# Patient Record
Sex: Female | Born: 1990 | Race: White | Hispanic: No | Marital: Married | Smoking: Never smoker
Health system: Southern US, Community
[De-identification: ages and names within clinical notes are randomized; demographics above are authoritative.]

## PROBLEM LIST (undated history)

## (undated) DIAGNOSIS — F32A Depression, unspecified: Secondary | ICD-10-CM

## (undated) DIAGNOSIS — G44209 Tension-type headache, unspecified, not intractable: Secondary | ICD-10-CM

## (undated) HISTORY — DX: Depression, unspecified: F32.A

## (undated) HISTORY — DX: Tension-type headache, unspecified, not intractable: G44.209

---

## 2021-02-08 HISTORY — PX: BILATERAL SALPINGECTOMY: SHX5743

## 2021-09-15 ENCOUNTER — Ambulatory Visit (INDEPENDENT_AMBULATORY_CARE_PROVIDER_SITE_OTHER): Payer: BC Managed Care – PPO | Admitting: Family

## 2021-09-15 ENCOUNTER — Encounter: Payer: Self-pay | Admitting: Family

## 2021-09-15 ENCOUNTER — Other Ambulatory Visit: Payer: Self-pay

## 2021-09-15 VITALS — BP 122/64 | HR 72 | Ht 63.0 in | Wt 153.0 lb

## 2021-09-15 DIAGNOSIS — Z124 Encounter for screening for malignant neoplasm of cervix: Secondary | ICD-10-CM | POA: Diagnosis not present

## 2021-09-15 DIAGNOSIS — R519 Headache, unspecified: Secondary | ICD-10-CM

## 2021-09-15 DIAGNOSIS — Z13220 Encounter for screening for lipoid disorders: Secondary | ICD-10-CM

## 2021-09-15 DIAGNOSIS — G44229 Chronic tension-type headache, not intractable: Secondary | ICD-10-CM | POA: Diagnosis not present

## 2021-09-15 LAB — COMPREHENSIVE METABOLIC PANEL
ALT: 10 U/L (ref 0–35)
AST: 15 U/L (ref 0–37)
Albumin: 4.9 g/dL (ref 3.5–5.2)
Alkaline Phosphatase: 35 U/L — ABNORMAL LOW (ref 39–117)
BUN: 15 mg/dL (ref 6–23)
CO2: 32 mEq/L (ref 19–32)
Calcium: 9.7 mg/dL (ref 8.4–10.5)
Chloride: 105 mEq/L (ref 96–112)
Creatinine, Ser: 0.76 mg/dL (ref 0.40–1.20)
GFR: 105.52 mL/min (ref >60.00)
Glucose, Bld: 87 mg/dL (ref 70–99)
Potassium: 4.5 mEq/L (ref 3.5–5.1)
Sodium: 141 mEq/L (ref 135–145)
Total Bilirubin: 0.6 mg/dL (ref 0.2–1.2)
Total Protein: 7.3 g/dL (ref 6.0–8.3)

## 2021-09-15 LAB — CBC WITH DIFFERENTIAL/PLATELET
Basophils Absolute: 0 10*3/uL (ref 0.0–0.1)
Basophils Relative: 0.7 % (ref 0.0–3.0)
Eosinophils Absolute: 0.2 10*3/uL (ref 0.0–0.7)
Eosinophils Relative: 4.1 % (ref 0.0–5.0)
HCT: 38.4 % (ref 36.0–46.0)
Hemoglobin: 13 g/dL (ref 12.0–15.0)
Lymphocytes Relative: 31.1 % (ref 12.0–46.0)
Lymphs Abs: 1.3 10*3/uL (ref 0.7–4.0)
MCHC: 33.8 g/dL (ref 30.0–36.0)
MCV: 93.7 fl (ref 78.0–100.0)
Monocytes Absolute: 0.3 10*3/uL (ref 0.1–1.0)
Monocytes Relative: 7.4 % (ref 3.0–12.0)
Neutro Abs: 2.3 10*3/uL (ref 1.4–7.7)
Neutrophils Relative %: 56.7 % (ref 43.0–77.0)
Platelets: 200 10*3/uL (ref 150.0–400.0)
RBC: 4.1 Mil/uL (ref 3.87–5.11)
RDW: 14 % (ref 11.5–15.5)
WBC: 4 10*3/uL (ref 4.0–10.5)

## 2021-09-15 LAB — C-REACTIVE PROTEIN: CRP: <1 mg/dL (ref 0.5–20.0)

## 2021-09-15 LAB — SEDIMENTATION RATE: Sed Rate: 8 mm/hr (ref 0–20)

## 2021-09-15 LAB — TSH: TSH: 1.35 u[IU]/mL (ref 0.35–5.50)

## 2021-09-15 MED ORDER — BUTALBITAL-APAP-CAFFEINE 50-325-40 MG PO TABS: 1.0000 | ORAL_TABLET | Freq: Four times a day (QID) | ORAL | 0 refills | Status: DC | PRN

## 2021-09-15 NOTE — Patient Instructions (Addendum)
I want you to start with flonase daily as well as claritin daily so we can make sure that it is not sinus related contributing to your headaches.   I am also going to be starting you on fioricet for suspected tension migraines, take as directed at onset of headache. Increase water intake. Also keep a headache diary for [place] and bring to your neurology appointment.   A referral was placed today for both GYN and neurology.  Please let us know if you have not heard back within 1 week about your referral.  Stop by the lab prior to leaving today. I will notify you of your results once received.   It was a pleasure seeing you today! Please do not hesitate to reach out with any questions and or concerns.  Regards,   Mort Sawyers FNP-C

## 2021-09-15 NOTE — Assessment & Plan Note (Signed)
Suspected tension headache as she does have a lot of tightness in her posterior neck.  Prescribed Fioricet which she will take for abrupt her headaches ibuprofen as needed increase oral intake of water throughout the day.  Keep headache diary I did refer to neuro as this is new onset in the last 1 year

## 2021-09-15 NOTE — Assessment & Plan Note (Signed)
Lab work-up in place to can include CRP sed rate and TSH

## 2021-09-15 NOTE — Assessment & Plan Note (Signed)
Patient referred to GYN for Pap smear.

## 2021-09-15 NOTE — Progress Notes (Signed)
New Patient Office Visit  Subjective:  Patient ID: Amy Stanton, female    DOB: 06/02/1991  Age: 31 y.o. MRN: [id number]  CC:  Chief Complaint  Patient presents with   Headache    Pt stated--have severe headache lasted for 24hr for about 1 year consistancy.    HPI Amy Stanton is here to establish care as a new patient. Recently moved here from [place] in [place]. Last saw primary care in summer of 2022.   Prior provider was:[facility]. [facility], [facility] [address] Pt is with acute concern   Headaches: started about 1.5 years ago while she was still in [place]. At first was thinking 'this is a bad headache' but would dissipate so didn't find too concerning. In the last few months noticing that her headaches are getting much more severe, and seem to be getting progressively worse. She denies an aura prior to headache, but does start feeling increasingly groggy and tired. Typically photosensitive as well, no change in vision, sensitive to noise , and if she has to turn her head in any way the headache will intensify. The headaches do intensify and occasionally will vomit. Typically always with nausea. Does also experience diarrhea during these episodes. No loss of function of limb, and does not experience numbness. Headache typically starts at base of head and goes up to back of crown of head and then goes to bil temples 'like a headband'. Will try to take tylenol, excedrin migraine, and sinus and allergy medications without any relief at all. Mild headaches last about once weekly, 'bad severe headaches' maybe 1-2 times a month but are becoming more frequent as prior maybe one bad one a month if even. Doesn't seem to link up with her cycle.   Does have a daily post nasal drip and does suffer from chronic allergies, does use Claritin and flonase off and on. Prior to claritin which she started a few days ago she was taking xyzal.   No chance in pregnancy as history of bil  salpingectomy.   Pap smear: Summer 2022 pt would like referral to gyn  Anxiety/depression   GAD 7 : Generalized Anxiety Score 09/15/2021  Nervous, Anxious, on Edge 3  Control/stop worrying 3  Worry too much - different things 3  Trouble relaxing 3  Restless 3  Easily annoyed or irritable 2  Afraid - awful might happen 0  Total GAD 7 Score 17  Anxiety Difficulty Somewhat difficult    PHQ9 SCORE ONLY 09/15/2021  PHQ-9 Total Score 10       Past Medical History:  Diagnosis Date   Depression     Past Surgical History:  Procedure Laterality Date   BILATERAL SALPINGECTOMY Bilateral 02/08/2021   pt chose to do this for suspected endometrosis    Family History  Problem Relation Age of Onset   Arthritis Mother    Migraines Mother    Sleep apnea Mother    Migraines Maternal Grandmother    Seizures Maternal Grandmother        not epilepsy    Social History   Socioeconomic History   Marital status: Married    Spouse name: david   Number of children: 0   Years of education: Not on file   Highest education level: Not on file  Occupational History   Not on file  Tobacco Use   Smoking status: Never   Smokeless tobacco: Never  Substance and Sexual Activity   Alcohol use: Never   Drug use:  Yes    Types: Marijuana    Comment: inhalation, daily, helps her regulate her anxiety/intestinal issues   Sexual activity: Yes    Partners: Male    Birth control/protection: Surgical  Other Topics Concern   Not on file  Social History Narrative   Home maker    Social Determinants of Health   Financial Resource Strain: Not on file  Food Insecurity: Not on file  Transportation Needs: Not on file  Physical Activity: Not on file  Stress: Not on file  Social Connections: Not on file  Intimate Partner Violence: Not on file    Outpatient Medications Prior to Visit  Medication Sig Dispense Refill   loratadine (CLARITIN) 10 MG tablet Take 10 mg by mouth daily.     No  facility-administered medications prior to visit.    Allergies  Allergen Reactions   Monistat [Miconazole]    Penicillins     ROS Review of Systems  Constitutional:  Negative for chills, fatigue and fever.  HENT:  Positive for congestion (occasional), postnasal drip and rhinorrhea.   Eyes:  Positive for visual disturbance (at time of migraine).  Respiratory:  Negative for cough and shortness of breath.   Cardiovascular:  Negative for chest pain and leg swelling.  Gastrointestinal:  Negative for diarrhea and nausea.  Genitourinary:  Negative for difficulty urinating.  Musculoskeletal:  Positive for neck pain (base of neck start of headche).  Neurological:  Positive for headaches. Negative for dizziness, tremors, facial asymmetry, speech difficulty, weakness, light-headedness and numbness.  Psychiatric/Behavioral:  Negative for agitation and sleep disturbance.   All other systems reviewed and are negative.      Objective:    Physical Exam Constitutional:      General: She is not in acute distress.    Appearance: She is well-developed and normal weight. She is not ill-appearing or toxic-appearing.  HENT:     Head: Normocephalic.     Mouth/Throat:     Mouth: Mucous membranes are moist.  Eyes:     Extraocular Movements: Extraocular movements intact.  Neck:   Cardiovascular:     Rate and Rhythm: Normal rate.     Heart sounds: Normal heart sounds. No murmur heard. Pulmonary:     Effort: Pulmonary effort is normal.     Breath sounds: Normal breath sounds.  Musculoskeletal:     Cervical back: No edema, erythema or crepitus. Pain with movement and muscular tenderness (see figure 1 and 2) present. Decreased range of motion (painful wrong with flexion only).  Neurological:     Mental Status: She is alert.  Psychiatric:        Mood and Affect: Mood normal.        Speech: Speech normal.        Behavior: Behavior normal.     BP 122/64    Pulse 72    Ht 5\' 3"  (1.6 m)    Wt  153 lb (69.4 kg)    SpO2 96%    BMI 27.10 kg/m  Wt Readings from Last 3 Encounters:  09/15/21 153 lb (69.4 kg)     There are no preventive care reminders to display for this patient.   There are no preventive care reminders to display for this patient.  Lab Results  Component Value Date   TSH 1.35 09/15/2021   Lab Results  Component Value Date   WBC 4.0 09/15/2021   HGB 13.0 09/15/2021   HCT 38.4 09/15/2021   MCV 93.7 09/15/2021   PLT 200.0  09/15/2021   Lab Results  Component Value Date   NA 141 09/15/2021   K 4.5 09/15/2021   CO2 32 09/15/2021   GLUCOSE 87 09/15/2021   BUN 15 09/15/2021   CREATININE 0.76 09/15/2021   BILITOT 0.6 09/15/2021   ALKPHOS 35 (L) 09/15/2021   AST 15 09/15/2021   ALT 10 09/15/2021   PROT 7.3 09/15/2021   ALBUMIN 4.9 09/15/2021   CALCIUM 9.7 09/15/2021   GFR 105.52 09/15/2021   No results found for: CHOL No results found for: HDL No results found for: LDLCALC No results found for: TRIG No results found for: CHOLHDL No results found for: HGBA1C    Assessment & Plan:   Problem List Items Addressed This Visit       Nervous and Auditory   Chronic tension-type headache, not intractable - Primary    Suspected tension headache as she does have a lot of tightness in her posterior neck.  Prescribed Fioricet which she will take for abrupt her headaches ibuprofen as needed increase oral intake of water throughout the day.  Keep headache diary I did refer to neuro as this is new onset in the last 1 year      Relevant Medications   butalbital-acetaminophen-caffeine (FIORICET) 50-325-40 MG tablet   Other Relevant Orders   Ambulatory referral to Neurology   CBC w/Diff (Completed)   Comprehensive metabolic panel (Completed)   TSH (Completed)   Sedimentation rate (Completed)   C-reactive protein (Completed)     Other   Nonintractable headache    Lab work-up in place to can include CRP sed rate and TSH      Relevant Medications    butalbital-acetaminophen-caffeine (FIORICET) 50-325-40 MG tablet   Other Relevant Orders   Ambulatory referral to Neurology   CBC w/Diff (Completed)   Comprehensive metabolic panel (Completed)   TSH (Completed)   Sedimentation rate (Completed)   C-reactive protein (Completed)   Ambulatory referral to Neurology   CBC w/Diff (Completed)   Comprehensive metabolic panel (Completed)   TSH (Completed)   Sedimentation rate (Completed)   C-reactive protein (Completed)   Screening for cervical cancer    Patient referred to GYN for Pap smear.      Relevant Orders   Ambulatory referral to Gynecology   Other Visit Diagnoses     Screening for lipoid disorders           Meds ordered this encounter  Medications   butalbital-acetaminophen-caffeine (FIORICET) 50-325-40 MG tablet    Sig: Take 1 tablet by mouth every 6 (six) hours as needed for headache.    Dispense:  14 tablet    Refill:  0    Order Specific Question:   Supervising Provider    Answer:   Diona Browner, AMY E [id number]    Follow-up: Return in about 1 month (around 10/13/2021) for follow up on new medication.    Eugenia Pancoast, FNP

## 2021-09-16 ENCOUNTER — Encounter: Payer: Self-pay | Admitting: Neurology

## 2021-09-16 ENCOUNTER — Encounter: Payer: Self-pay | Admitting: Family

## 2021-09-22 NOTE — Telephone Encounter (Signed)
Lvm for pt to call and schedule a my chart video

## 2021-09-23 ENCOUNTER — Encounter: Payer: Self-pay | Admitting: Family

## 2021-09-23 ENCOUNTER — Other Ambulatory Visit: Payer: Self-pay

## 2021-09-23 ENCOUNTER — Telehealth (INDEPENDENT_AMBULATORY_CARE_PROVIDER_SITE_OTHER): Payer: BC Managed Care – PPO | Admitting: Family

## 2021-09-23 VITALS — Ht 63.0 in | Wt 155.0 lb

## 2021-09-23 DIAGNOSIS — G43019 Migraine without aura, intractable, without status migrainosus: Secondary | ICD-10-CM | POA: Diagnosis not present

## 2021-09-23 DIAGNOSIS — M542 Cervicalgia: Secondary | ICD-10-CM | POA: Diagnosis not present

## 2021-09-23 MED ORDER — SUMATRIPTAN SUCCINATE 25 MG PO TABS: ORAL_TABLET | ORAL | 0 refills | Status: DC

## 2021-09-23 NOTE — Progress Notes (Signed)
MyChart Video Visit    Virtual Visit via Video Note   This visit type was conducted due to national recommendations for restrictions regarding the COVID-19 Pandemic (e.g. social distancing) in an effort to limit this patient's exposure and mitigate transmission in our community. This patient is at least at moderate risk for complications without adequate follow up. This format is felt to be most appropriate for this patient at this time. Physical exam was limited by quality of the video and audio technology used for the visit. CMA was able to get the patient set up on a video visit.  Patient location: Home. Patient and provider in visit Provider location: Office  I discussed the limitations of evaluation and management by telemedicine and the availability of in person appointments. The patient expressed understanding and agreed to proceed.  Visit Date: 09/23/2021  Today's healthcare provider: Mort Sawyers, FNP     Subjective:    Patient ID: Amy Stanton, female    DOB: 06-29-1991, 31 y.o.   MRN: [id number]  Chief Complaint  Patient presents with   Headache    Yesterday with severe headache and the fiorecet did not help at all.    HPI  Pt here with concerns.  Yesterday am woke up with worse headache she has had in some time, and headache worsened despite taking two doses of the fioricet. Did not provide her with any relief at all. Did take a nap and was able to find a relief when she woke up. She did feel tenderness and lower left sided neck pain and performed some stretches which did seem to give her some migraine relief. Prior to getting into bed last night she finally started to feel relief. She lowered arms on her computer chair which also has helped relief the headache as well.   Has had recent eye exam in the august of last year, all was ok.    Past Medical History:  Diagnosis Date   Depression     Past Surgical History:  Procedure Laterality Date   BILATERAL  SALPINGECTOMY Bilateral 02/08/2021   pt chose to do this for suspected endometrosis    Family History  Problem Relation Age of Onset   Arthritis Mother    Migraines Mother    Sleep apnea Mother    Migraines Maternal Grandmother    Seizures Maternal Grandmother        not epilepsy    Social History   Socioeconomic History   Marital status: Married    Spouse name: david   Number of children: 0   Years of education: Not on file   Highest education level: Not on file  Occupational History   Not on file  Tobacco Use   Smoking status: Never   Smokeless tobacco: Never  Substance and Sexual Activity   Alcohol use: Never   Drug use: Yes    Types: Marijuana    Comment: inhalation, daily, helps her regulate her anxiety/intestinal issues   Sexual activity: Yes    Partners: Male    Birth control/protection: Surgical  Other Topics Concern   Not on file  Social History Narrative   Home maker    Social Determinants of Health   Financial Resource Strain: Not on file  Food Insecurity: Not on file  Transportation Needs: Not on file  Physical Activity: Not on file  Stress: Not on file  Social Connections: Not on file  Intimate Partner Violence: Not on file    Outpatient Medications Prior to  Visit  Medication Sig Dispense Refill   butalbital-acetaminophen-caffeine (FIORICET) 50-325-40 MG tablet Take 1 tablet by mouth every 6 (six) hours as needed for headache. 14 tablet 0   loratadine (CLARITIN) 10 MG tablet Take 10 mg by mouth daily.     No facility-administered medications prior to visit.    Allergies  Allergen Reactions   Monistat [Miconazole]    Penicillins     Review of Systems  Constitutional:  Negative for chills, fever and unexpected weight change.  Eyes:  Negative for photophobia and visual disturbance.  Respiratory:  Negative for choking, chest tightness and shortness of breath.   Cardiovascular:  Negative for chest pain, palpitations and leg swelling.   Musculoskeletal:  Positive for neck pain.  Neurological:  Positive for headaches. Negative for tremors, seizures, syncope, weakness, light-headedness and numbness.      Objective:    Physical Exam Constitutional:      General: She is not in acute distress.    Appearance: She is not ill-appearing, toxic-appearing or diaphoretic.  Pulmonary:     Effort: Pulmonary effort is normal.  Neurological:     Mental Status: She is alert.    Ht 5\' 3"  (1.6 m)    Wt 155 lb (70.3 kg)    BMI 27.46 kg/m  Wt Readings from Last 3 Encounters:  09/23/21 155 lb (70.3 kg)  09/15/21 153 lb (69.4 kg)       Assessment & Plan:   Problem List Items Addressed This Visit       Cardiovascular and Mediastinum   Migraine without aura, intractable, without status migrainosus - Primary    D/C fioricet , trial of imitrex for migraines.  Reviewed labs, not concerning.  Pt to remain on wait list for neuro consult, consider MRI.  Avoid migraine triggers. Continue with neck stretching exercises.       Relevant Medications   SUMAtriptan (IMITREX) 25 MG tablet     Other   Neck pain on left side    Muscle relaxer as needed neck stretching exercises as well as the seem to be helping       I am having Amy Stanton start on SUMAtriptan. I am also having her maintain her loratadine and butalbital-acetaminophen-caffeine.  Meds ordered this encounter  Medications   SUMAtriptan (IMITREX) 25 MG tablet    Sig: Take one po at onset of migraine, can repeat after two hours if still with symptoms prn migraine    Dispense:  10 tablet    Refill:  0    Order Specific Question:   Supervising Provider    Answer:   BEDSOLE, AMY E [[id number]]    I discussed the assessment and treatment plan with the patient. The patient was provided an opportunity to ask questions and all were answered. The patient agreed with the plan and demonstrated an understanding of the instructions.   The patient was advised to call back or seek  an in-person evaluation if the symptoms worsen or if the condition fails to improve as anticipated.  I provided 15 minutes of face-to-face time during this encounter.   09/17/21, FNP [facility] [phone] (phone) [phone] (fax)  [facility]

## 2021-09-26 DIAGNOSIS — M542 Cervicalgia: Secondary | ICD-10-CM | POA: Insufficient documentation

## 2021-09-26 DIAGNOSIS — G43019 Migraine without aura, intractable, without status migrainosus: Secondary | ICD-10-CM | POA: Insufficient documentation

## 2021-09-26 NOTE — Assessment & Plan Note (Signed)
D/C fioricet , trial of imitrex for migraines.  Reviewed labs, not concerning.  Pt to remain on wait list for neuro consult, consider MRI.  Avoid migraine triggers. Continue with neck stretching exercises.

## 2021-09-26 NOTE — Assessment & Plan Note (Signed)
Muscle relaxer as needed neck stretching exercises as well as the seem to be helping

## 2021-10-18 ENCOUNTER — Encounter: Payer: Self-pay | Admitting: Family

## 2021-10-18 DIAGNOSIS — G43019 Migraine without aura, intractable, without status migrainosus: Secondary | ICD-10-CM

## 2021-10-18 NOTE — Progress Notes (Signed)
? ?PCP:  Mort Sawyersugal, Tabitha, FNP ? ? ?Chief Complaint  ?Patient presents with  ? Gynecologic Exam  ?  Internal vaginal lump, not painful x 1 month  ? ? ? ?HPI: ?     Ms. Amy Stanton is a 31 y.o. G0P0000 whose LMP was Patient's last menstrual period was 10/07/2021 (exact date)., presents today for her NP annual examination.  Her menses are regular every 28-30 days, lasting 5-7 days.  Dysmenorrhea moderate, sometimes improved with midol; needs to minimize NSAIDs per GI due to GERD/IBS. She does not have intermenstrual bleeding. ? ?Sex activity: single partner, contraception - vasectomy. No pain bleeding. Noticed a bump inside vaginally about a month ago. No pain/change in size ?Last Pap: a few yrs ago; no hx of abn paps.  ? ?There is no FH of breast cancer. There is no FH of ovarian cancer. The patient does do self-breast exams. Hx of fibrocystic breasts with neg breast u/s in past.  ? ?Tobacco use: The patient denies current or previous tobacco use. ?Alcohol use: none ?Daily marijuana use.  ?Exercise: moderately active ? ?She does not get adequate calcium and Vitamin D in her diet. ? ?Patient Active Problem List  ? Diagnosis Date Noted  ? Dysmenorrhea 10/19/2021  ? Migraine without aura, intractable, without status migrainosus 09/26/2021  ? Neck pain on left side 09/26/2021  ? Nonintractable headache 09/15/2021  ? Chronic tension-type headache, not intractable 09/15/2021  ? Screening for cervical cancer 09/15/2021  ? ? ?Past Surgical History:  ?Procedure Laterality Date  ? BILATERAL SALPINGECTOMY Bilateral 02/08/2021  ? pt chose to do this for suspected endometrosis  ? ? ?Family History  ?Problem Relation Age of Onset  ? Arthritis Mother   ? Migraines Mother   ? Sleep apnea Mother   ? Migraines Maternal Grandmother   ? Seizures Maternal Grandmother   ?     not epilepsy  ? ? ?Social History  ? ?Socioeconomic History  ? Marital status: Married  ?  Spouse name: Onalee Huadavid  ? Number of children: 0  ? Years of education: Not  on file  ? Highest education level: Not on file  ?Occupational History  ? Not on file  ?Tobacco Use  ? Smoking status: Never  ? Smokeless tobacco: Never  ?Vaping Use  ? Vaping Use: Never used  ?Substance and Sexual Activity  ? Alcohol use: Never  ? Drug use: Yes  ?  Types: Marijuana  ?  Comment: inhalation, daily, helps her regulate her anxiety/intestinal issues  ? Sexual activity: Yes  ?  Partners: Male  ?  Birth control/protection: Surgical  ?Other Topics Concern  ? Not on file  ?Social History Narrative  ? Home maker   ? ?Social Determinants of Health  ? ?Financial Resource Strain: Not on file  ?Food Insecurity: Not on file  ?Transportation Needs: Not on file  ?Physical Activity: Not on file  ?Stress: Not on file  ?Social Connections: Not on file  ?Intimate Partner Violence: Not on file  ? ? ? ?Current Outpatient Medications:  ?  butalbital-acetaminophen-caffeine (FIORICET) 50-325-40 MG tablet, Take 1 tablet by mouth every 6 (six) hours as needed for headache., Disp: 14 tablet, Rfl: 0 ?  ibuprofen (ADVIL) 800 MG tablet, Take 1 tablet (800 mg total) by mouth every 8 (eight) hours as needed., Disp: 30 tablet, Rfl: 1 ?  loratadine (CLARITIN) 10 MG tablet, Take 10 mg by mouth daily., Disp: , Rfl:  ?  SUMAtriptan (IMITREX) 25 MG tablet, Take one po  at onset of migraine, can repeat after two hours if still with symptoms prn migraine, Disp: 10 tablet, Rfl: 0 ? ? ? ? ?ROS: ? ?Review of Systems  ?Constitutional:  Negative for fatigue, fever and unexpected weight change.  ?Respiratory:  Negative for cough, shortness of breath and wheezing.   ?Cardiovascular:  Negative for chest pain, palpitations and leg swelling.  ?Gastrointestinal:  Negative for blood in stool, constipation, diarrhea, nausea and vomiting.  ?Endocrine: Negative for cold intolerance, heat intolerance and polyuria.  ?Genitourinary:  Negative for dyspareunia, dysuria, flank pain, frequency, genital sores, hematuria, menstrual problem, pelvic pain, urgency,  vaginal bleeding, vaginal discharge and vaginal pain.  ?Musculoskeletal:  Negative for back pain, joint swelling and myalgias.  ?Skin:  Negative for rash.  ?Neurological:  Positive for headaches. Negative for dizziness, syncope, light-headedness and numbness.  ?Hematological:  Negative for adenopathy.  ?Psychiatric/Behavioral:  Negative for agitation, confusion, sleep disturbance and suicidal ideas. The patient is not nervous/anxious.   ?BREAST: No symptoms ? ? ?Objective: ?BP 100/70   Ht 5\' 2"  (1.575 m)   Wt 158 lb (71.7 kg)   LMP 10/07/2021 (Exact Date)   BMI 28.90 kg/m?  ? ? ?Physical Exam ?Constitutional:   ?   Appearance: She is well-developed.  ?Genitourinary:  ?   Vulva normal.  ?   Right Labia: No rash, tenderness or lesions. ?   Left Labia: No tenderness, lesions or rash. ?   No vaginal discharge, erythema or tenderness.  ? ?   Right Adnexa: not tender and no mass present. ?   Left Adnexa: not tender and no mass present. ?   Cervical polyp present.  ?   No cervical friability.  ? ? ? ?   Uterus is not enlarged or tender.  ?Breasts: ?   Right: No mass, nipple discharge, skin change or tenderness.  ?   Left: No mass, nipple discharge, skin change or tenderness.  ?Neck:  ?   Thyroid: No thyromegaly.  ?Cardiovascular:  ?   Rate and Rhythm: Normal rate and regular rhythm.  ?   Heart sounds: Normal heart sounds. No murmur heard. ?Pulmonary:  ?   Effort: Pulmonary effort is normal.  ?   Breath sounds: Normal breath sounds.  ?Abdominal:  ?   Palpations: Abdomen is soft.  ?   Tenderness: There is no abdominal tenderness. There is no guarding or rebound.  ?Musculoskeletal:     ?   General: Normal range of motion.  ?   Cervical back: Normal range of motion.  ?Lymphadenopathy:  ?   Cervical: No cervical adenopathy.  ?Neurological:  ?   General: No focal deficit present.  ?   Mental Status: She is alert and oriented to person, place, and time.  ?   Cranial Nerves: No cranial nerve deficit.  ?Skin: ?   General: Skin  is warm and dry.  ?Psychiatric:     ?   Mood and Affect: Mood normal.     ?   Behavior: Behavior normal.     ?   Thought Content: Thought content normal.     ?   Judgment: Judgment normal.  ?Vitals reviewed.  ? ? ?Assessment/Plan: ?Encounter for annual routine gynecological examination ? ?Cervical cancer screening - Plan: Cytology - PAP ? ?Screening for HPV (human papillomavirus) - Plan: Cytology - PAP ? ?Dysmenorrhea - Plan: ibuprofen (ADVIL) 800 MG tablet; Rx ibup to take with sx start, can take sparingly. F/u prn.  ? ?Nabothian cyst--reassurance. F/u prn.  ? ?  Meds ordered this encounter  ?Medications  ? ibuprofen (ADVIL) 800 MG tablet  ?  Sig: Take 1 tablet (800 mg total) by mouth every 8 (eight) hours as needed.  ?  Dispense:  30 tablet  ?  Refill:  1  ? ?          ?GYN counsel adequate intake of calcium and vitamin D, diet and exercise ? ? ?  F/U ? Return in about 1 year (around 10/20/2022). ? ?Tyree Vandruff B. Caitlin Hillmer, PA-C ?10/19/2021 ?9:06 AM ?

## 2021-10-19 ENCOUNTER — Other Ambulatory Visit (HOSPITAL_COMMUNITY)
Admission: RE | Admit: 2021-10-19 | Discharge: 2021-10-19 | Disposition: A | Discharge status: Home / Self Care | Payer: BC Managed Care – PPO | Source: Ambulatory Visit | Attending: Obstetrics and Gynecology | Admitting: Obstetrics and Gynecology

## 2021-10-19 ENCOUNTER — Ambulatory Visit (INDEPENDENT_AMBULATORY_CARE_PROVIDER_SITE_OTHER): Payer: BC Managed Care – PPO | Admitting: Obstetrics and Gynecology

## 2021-10-19 ENCOUNTER — Other Ambulatory Visit: Payer: Self-pay

## 2021-10-19 ENCOUNTER — Encounter: Payer: Self-pay | Admitting: Obstetrics and Gynecology

## 2021-10-19 VITALS — BP 100/70 | Ht 62.0 in | Wt 158.0 lb

## 2021-10-19 DIAGNOSIS — N946 Dysmenorrhea, unspecified: Secondary | ICD-10-CM

## 2021-10-19 DIAGNOSIS — Z1151 Encounter for screening for human papillomavirus (HPV): Secondary | ICD-10-CM

## 2021-10-19 DIAGNOSIS — Z01419 Encounter for gynecological examination (general) (routine) without abnormal findings: Secondary | ICD-10-CM

## 2021-10-19 DIAGNOSIS — Z124 Encounter for screening for malignant neoplasm of cervix: Secondary | ICD-10-CM | POA: Insufficient documentation

## 2021-10-19 DIAGNOSIS — N888 Other specified noninflammatory disorders of cervix uteri: Secondary | ICD-10-CM

## 2021-10-19 MED ORDER — IBUPROFEN 800 MG PO TABS: 800.0000 mg | ORAL_TABLET | Freq: Three times a day (TID) | ORAL | 1 refills | Status: DC | PRN

## 2021-10-19 NOTE — Patient Instructions (Signed)
I value your feedback and you entrusting us with your care. If you get a [facility] patient survey, I would appreciate you taking the time to let us know about your experience today. Thank you! ? ? ?

## 2021-10-21 LAB — CYTOLOGY - PAP
Comment: NEGATIVE
Diagnosis: NEGATIVE
High risk HPV: NEGATIVE

## 2021-10-25 MED ORDER — TOPIRAMATE 25 MG PO TABS: 25.0000 mg | ORAL_TABLET | Freq: Every day | ORAL | 0 refills | Status: DC

## 2021-11-11 ENCOUNTER — Ambulatory Visit
Admission: EM | Admit: 2021-11-11 | Discharge: 2021-11-11 | Disposition: A | Discharge status: Home / Self Care | Payer: BC Managed Care – PPO | Attending: Urgent Care | Admitting: Urgent Care

## 2021-11-11 DIAGNOSIS — R519 Headache, unspecified: Secondary | ICD-10-CM

## 2021-11-11 MED ORDER — KETOROLAC TROMETHAMINE 60 MG/2ML IM SOLN
60.0000 mg | Freq: Once | INTRAMUSCULAR | Status: AC
  Administered 2021-11-11: 60 mg via INTRAMUSCULAR

## 2021-11-11 NOTE — Telephone Encounter (Signed)
Unable to reach pt or pts husband (DPR signed) on phone and left v/m requesting pt to call [facility] will also send my chart note to pt.   Sending note to [facility] traige and T Alfonse Alpers FNP as Lorain Childes. ?

## 2021-11-11 NOTE — ED Triage Notes (Signed)
Pt reports on and off  headache x 1 year. States she's being working with her PCP for the headache. Pt reports today she is having severe headache, severe nausea, groggy, tired, not appetite since this morning; headache "coming in waves" since last night. States the headache today is not responding to any medication (Topamax) , water or caffeine.  ? ?Pt has an appt with Neurologist 11/21/2021. ?

## 2021-11-11 NOTE — ED Provider Notes (Signed)
?[place] ? ? ?MRN: [id number] DOB: 11/24/1990 ? ?Subjective:  ? ?Amy Stanton is a 31 y.o. female presenting for 1 day history of acute onset persistent moderate to severe intermittent headache all over.  Has had these generalized headaches for the past year.  Has been managed with medications such as Fioricet, sumatriptan, Topamax and has not had relief of her headaches consistently with these medications.  Denies vision changes, weakness, numbness, tingling, burning sensations, photophobia, eye pain, vision changes, slurred speech, neck pain, neck stiffness.  Patient does have a referral and a consultation coming up with neurology in 10 days.  She did contact her PCP who advised that she come in for an evaluation to make sure she was not having an emergency level headache.  Patient has tried multiple measures to help control her headaches outside of medication still remains unsuccessful.  No alcohol use, smoking, vaping. ? ?No current facility-administered medications for this encounter. ? ?Current Outpatient Medications:  ?  butalbital-acetaminophen-caffeine (FIORICET) 50-325-40 MG tablet, Take 1 tablet by mouth every 6 (six) hours as needed for headache., Disp: 14 tablet, Rfl: 0 ?  ibuprofen (ADVIL) 800 MG tablet, Take 1 tablet (800 mg total) by mouth every 8 (eight) hours as needed., Disp: 30 tablet, Rfl: 1 ?  loratadine (CLARITIN) 10 MG tablet, Take 10 mg by mouth daily., Disp: , Rfl:  ?  SUMAtriptan (IMITREX) 25 MG tablet, Take one po at onset of migraine, can repeat after two hours if still with symptoms prn migraine, Disp: 10 tablet, Rfl: 0 ?  topiramate (TOPAMAX) 25 MG tablet, Take 1 tablet (25 mg total) by mouth daily., Disp: 30 tablet, Rfl: 0  ? ?Allergies  ?Allergen Reactions  ? Monistat [Miconazole]   ? Penicillins   ? ? ?Past Medical History:  ?Diagnosis Date  ? Depression   ?  ? ?Past Surgical History:  ?Procedure Laterality Date  ? BILATERAL SALPINGECTOMY Bilateral 02/08/2021   ? pt chose to do this for suspected endometrosis  ? ? ?Family History  ?Problem Relation Age of Onset  ? Arthritis Mother   ? Migraines Mother   ? Sleep apnea Mother   ? Migraines Maternal Grandmother   ? Seizures Maternal Grandmother   ?     not epilepsy  ? ? ?Social History  ? ?Tobacco Use  ? Smoking status: Never  ? Smokeless tobacco: Never  ?Vaping Use  ? Vaping Use: Never used  ?Substance Use Topics  ? Alcohol use: Never  ? Drug use: Yes  ?  Types: Marijuana  ?  Comment: inhalation, daily, helps her regulate her anxiety/intestinal issues  ? ? ?ROS ? ? ?Objective:  ? ?Vitals: ?BP 125/82 (BP Location: Right Arm)   Pulse 90   Temp 99.1 ?F (37.3 ?C) (Oral)   Resp 16   LMP 11/07/2021 (Exact Date)   SpO2 98%  ? ?Physical Exam ?Constitutional:   ?   General: She is not in acute distress. ?   Appearance: Normal appearance. She is well-developed and normal weight. She is not ill-appearing, toxic-appearing or diaphoretic.  ?HENT:  ?   Head: Normocephalic and atraumatic.  ?   Right Ear: Tympanic membrane, ear canal and external ear normal. No drainage or tenderness. No middle ear effusion. There is no impacted cerumen. Tympanic membrane is not erythematous.  ?   Left Ear: Tympanic membrane, ear canal and external ear normal. No drainage or tenderness.  No middle ear effusion. There is no impacted cerumen. Tympanic membrane is not  erythematous.  ?   Nose: Nose normal. No congestion or rhinorrhea.  ?   Mouth/Throat:  ?   Mouth: Mucous membranes are moist. No oral lesions.  ?   Pharynx: No pharyngeal swelling, oropharyngeal exudate, posterior oropharyngeal erythema or uvula swelling.  ?   Tonsils: No tonsillar exudate or tonsillar abscesses.  ?Eyes:  ?   General: No visual field deficit or scleral icterus.    ?   Right eye: No discharge.     ?   Left eye: No discharge.  ?   Extraocular Movements: Extraocular movements intact.  ?   Right eye: Normal extraocular motion.  ?   Left eye: Normal extraocular motion.  ?    Conjunctiva/sclera: Conjunctivae normal.  ?   Pupils: Pupils are equal, round, and reactive to light.  ?Neck:  ?   Meningeal: Brudzinski's sign and Kernig's sign absent.  ?Cardiovascular:  ?   Rate and Rhythm: Normal rate.  ?Pulmonary:  ?   Effort: Pulmonary effort is normal.  ?Musculoskeletal:  ?   Cervical back: Normal range of motion and neck supple. No rigidity.  ?Lymphadenopathy:  ?   Cervical: No cervical adenopathy.  ?Skin: ?   General: Skin is warm and dry.  ?Neurological:  ?   General: No focal deficit present.  ?   Mental Status: She is alert and oriented to person, place, and time.  ?   Cranial Nerves: No cranial nerve deficit, dysarthria or facial asymmetry.  ?   Motor: No weakness, tremor, atrophy, abnormal muscle tone, seizure activity or pronator drift.  ?   Coordination: Romberg sign negative. Coordination normal. Finger-Nose-Finger Test and Heel to [facility] Test normal. Rapid alternating movements normal.  ?   Gait: Gait and tandem walk normal.  ?   Deep Tendon Reflexes: Reflexes normal.  ?Psychiatric:     ?   Mood and Affect: Mood normal.     ?   Behavior: Behavior normal.  ? ?IM Toradol given in clinic at 60 mg. ? ? ?Assessment and Plan :  ? ?PDMP not reviewed this encounter. ? ?1. Generalized headache   ?2. Persistent headaches   ? ?Undifferentiated generalized headaches.  Offered patient Toradol which the patient was agreeable to.  Recommended continued follow-up with her PCP and encouraged her to keep her appointment with a neurologist.  Offered her the information to the headache clinic in [place].  Patient will reach out to them.  No signs of an acute encephalopathy. Counseled patient on potential for adverse effects with medications prescribed/recommended today, ER and return-to-clinic precautions discussed, patient verbalized understanding. ? ?  ?Jaynee Eagles, PA-C ?11/11/21 1832 ? ?

## 2021-11-11 NOTE — Telephone Encounter (Addendum)
I spoke with pt; pt said the 3rd H/a since starting the topamax 25 mg began last night and has continued to the present; pt said right now pain level is 6-7 and little earlier was 8-9. Pt is very nauseated but has not vomited. Pt said at times she has had slight dizziness where room spins for short period and pt having problems with focusing, vision is blurred  and pt is feeling really tired. Pt started on 10/26/21 taking Topamax 25 mg one daily; pt has not started taking topamax 50 mg daily. Pt has not taken sumatriptan today and pt said when she has taken sumatriptan in the past it did not help H/A. Pt does have neurology appt 11/21/21 but pt wants to know what can be done before neurology appt. [facility] [address]. Sending note to T Dugal FNP and Tresa Endo CMA and will teams [place]. Pt said she will wait for cb or my chart message back. UC & ED precautions given and pt voiced understanding. I spoke with [place] and she said with pts pain level and her intermittent dizziness and vision changes pt should be ev al  at ED or UC with the understanding if she goes to UC pt may be told she needs to go to ED. Pt voiced understanding and pt said as bad as her head hurts pt cannot sit for hours in ED and pt is going to have her husband take her to [facility] in [place] now. Pt will cb next wk with update on how she is feeling. Sending note to Hayden Pedro FNP. Pt appreciative for cb. ?

## 2021-11-15 ENCOUNTER — Telehealth: Payer: Self-pay

## 2021-11-15 NOTE — Telephone Encounter (Signed)
Called to follow up on pt left vm to return call to the office. ?

## 2021-11-15 NOTE — Telephone Encounter (Signed)
Spoke to pt she said since she went to the UC that she has not had a headache since. ?

## 2021-11-17 NOTE — Progress Notes (Signed)
? ?NEUROLOGY CONSULTATION NOTE ? ?Amy Stanton ?MRN: [id number] ?DOB: [birth year] ? ?Referring provider: Mort Sawyers, FNP ?Primary care provider: Mort Sawyers, FNP ? ?Reason for consult:  headaches ? ?Assessment/Plan:  ? ?Chronic migraine without aura, without status migrainosus, intractable ? ?As she has been having daily headaches, often debilitating, will check MRI of brain with and without contrast ?Migraine prevention:  increase topiramate to 50mg  at bedtime. If no improvement in 4 weeks, she will contact me and will increase dose to 100mg  at bedtime ?Migraine rescue:  rizatriptan 10mg  and Zofran 8mg  ?Limit use of pain relievers to no more than 2 days out of week to prevent risk of rebound or medication-overuse headache. ?Keep headache diary ?Follow up 4-5 months. ? ? ? ?Subjective:  ?Amy Stanton is a 31 year old right-handed female who presents for headaches.  History supplemented by Urgent Care and referring provider's notes.  She is accompanied by her husband.  ? ?Onset:  2022.  Progressively more severe and more frequent. ?Location:  across occipital region, when flares up diffuse like a cap ?Quality:  pressure/squeezing, throbbing ?Intensity:  severe 8-8.5/10 (once 9-10/10).  26 ?Aura:  absent ?Prodrome:  absent ?Associated symptoms:  Nausea and vomiting when severe, sometimes phonophobia, lethargy.  She denies associated photophobia, unilateral numbness or weakness. ?Duration:  off and on in waves over 24 hours (sometimes 48 hours) ?Frequency:  4-5 days. 1 severe headache a week ?Frequency of abortive medication: none ?Triggers:  unknown ?Relieving factors:  none ?Activity:  movement aggravates ?These headaches are often debilitating.  Was seen in Urgent Care a week ago and received an injection.  No headache since then. ? ?Current NSAIDS/analgesics:  none ?Current triptans:  none ?Current ergotamine:  none ?Current anti-emetic:  none ?Current muscle relaxants:  none ?Current Antihypertensive  medications:  none ?Current Antidepressant medications:  none ?Current Anticonvulsant medications:  topiramate 25mg  daily ?Current anti-CGRP:  none ?Current Vitamins/Herbal/Supplements:  none ?Current Antihistamines/Decongestants:  loratadine ?Other therapy:  none ?Hormone/birth control:  none ? ?Past NSAIDS/analgesics:  acetaminophen, Fioricet, Excedrin, ibuprofen ?Past abortive triptans:  sumatriptan tab ?Past abortive ergotamine:  none ?Past muscle relaxants:  none ?Past anti-emetic:  none ?Past antihypertensive medications:  none ?Past antidepressant medications:  sertraline ?Past anticonvulsant medications:  none ?Past anti-CGRP:  none ?Past vitamins/Herbal/Supplements:  none ?Past antihistamines/decongestants:  none ?Other past therapies:  none ? ?Caffeine:  1 to 2 cups of coffee daily, energy drink 2 a week ?Alcohol:  no ?Smoker:  marijuana ?Diet:  64 oz of water daily.  Usually does not skip meals.  Not necessarily healthy foods (egg rolls) ?Exercise:  upper body stretching, yoga ?Depression:  yes; Anxiety:  yes ?Other pain:  some back pain, leg pain, shoulder pain ?Sleep hygiene:  ok ?Family history of headache:  mom (probably migraines) ? ? ?PAST MEDICAL HISTORY: ?Past Medical History:  ?Diagnosis Date  ? Depression   ? ? ?PAST SURGICAL HISTORY: ?Past Surgical History:  ?Procedure Laterality Date  ? BILATERAL SALPINGECTOMY Bilateral 02/08/2021  ? pt chose to do this for suspected endometrosis  ? ? ?MEDICATIONS: ?Current Outpatient Medications on File Prior to Visit  ?Medication Sig Dispense Refill  ? butalbital-acetaminophen-caffeine (FIORICET) 50-325-40 MG tablet Take 1 tablet by mouth every 6 (six) hours as needed for headache. 14 tablet 0  ? ibuprofen (ADVIL) 800 MG tablet Take 1 tablet (800 mg total) by mouth every 8 (eight) hours as needed. 30 tablet 1  ? loratadine (CLARITIN) 10 MG tablet Take 10 mg by mouth daily.    ?  SUMAtriptan (IMITREX) 25 MG tablet Take one po at onset of migraine, can repeat  after two hours if still with symptoms prn migraine 10 tablet 0  ? topiramate (TOPAMAX) 25 MG tablet Take 1 tablet (25 mg total) by mouth daily. 30 tablet 0  ? ?No current facility-administered medications on file prior to visit.  ? ? ?ALLERGIES: ?Allergies  ?Allergen Reactions  ? Monistat [Miconazole]   ? Penicillins   ? ? ?FAMILY HISTORY: ?Family History  ?Problem Relation Age of Onset  ? Arthritis Mother   ? Migraines Mother   ? Sleep apnea Mother   ? Migraines Maternal Grandmother   ? Seizures Maternal Grandmother   ?     not epilepsy  ? ? ?Objective:  ?Blood pressure 106/67, pulse 80, height 5\' 2"  (1.575 m), weight 158 lb 3.2 oz (71.8 kg), last menstrual period 11/07/2021, SpO2 98 %. ?General: No acute distress.  Patient appears well-groomed.   ?Head:  Normocephalic/atraumatic ?Eyes:  fundi examined but not visualized ?Neck: supple, no paraspinal tenderness, full range of motion ?Heart: regular rate and rhythm ?Neurological Exam: ?Mental status: alert and oriented to person, place, and time, recent and remote memory intact, fund of knowledge intact, attention and concentration intact, speech fluent and not dysarthric, language intact. ?Cranial nerves: ?CN I: not tested ?CN II: pupils equal, round and reactive to light, visual fields intact ?CN III, IV, VI:  full range of motion, no nystagmus, no ptosis ?CN V: facial sensation intact. ?CN VII: upper and lower face symmetric ?CN VIII: hearing intact ?CN IX, X: gag intact, uvula midline ?CN XI: sternocleidomastoid and trapezius muscles intact ?CN XII: tongue midline ?Bulk & Tone: normal, no fasciculations. ?Motor:  muscle strength 5/5 throughout ?Sensation:  Pinprick, temperature and vibratory sensation intact. ?Deep Tendon Reflexes:  2+ throughout,  toes downgoing.   ?Finger to nose testing:  Without dysmetria.   ?Heel to shin:  Without dysmetria.   ?Gait:  Normal station and stride.  Romberg negative. ? ? ? ?Thank you for allowing me to take part in the care of  this patient. ? ?01/07/2022, DO ? ?CC: Shon Millet, FNP ? ? ? ? ?

## 2021-11-20 ENCOUNTER — Encounter: Payer: Self-pay | Admitting: Family

## 2021-11-21 ENCOUNTER — Ambulatory Visit (INDEPENDENT_AMBULATORY_CARE_PROVIDER_SITE_OTHER): Payer: BC Managed Care – PPO | Admitting: Neurology

## 2021-11-21 ENCOUNTER — Encounter: Payer: Self-pay | Admitting: Neurology

## 2021-11-21 VITALS — BP 106/67 | HR 80 | Ht 62.0 in | Wt 158.2 lb

## 2021-11-21 DIAGNOSIS — G43719 Chronic migraine without aura, intractable, without status migrainosus: Secondary | ICD-10-CM

## 2021-11-21 MED ORDER — ONDANSETRON HCL 8 MG PO TABS: 8.0000 mg | ORAL_TABLET | Freq: Three times a day (TID) | ORAL | 5 refills | Status: DC | PRN

## 2021-11-21 MED ORDER — TOPIRAMATE 50 MG PO TABS: 50.0000 mg | ORAL_TABLET | Freq: Every day | ORAL | 5 refills | Status: DC

## 2021-11-21 MED ORDER — RIZATRIPTAN BENZOATE 10 MG PO TABS: ORAL_TABLET | ORAL | 5 refills | Status: DC

## 2021-11-21 NOTE — Patient Instructions (Addendum)
?  Will check MRI of brain with and without contrast ?Increase topiramate to 50mg  at bedtime.  Contact in 4 weeks with update and we can increase dose if needed. ?Take rizatriptan 10mg  at earliest onset of headache.  May repeat dose once in 2 hours if needed.  Maximum 2 tablets in 24 hours. ?Take ondansetron 8mg  as needed for nausea associated with migraine.   ?Limit use of pain relievers to no more than 2 days out of the week.  These medications include acetaminophen, NSAIDs (ibuprofen/Advil/Motrin, naproxen/Aleve, triptans (Imitrex/sumatriptan), Excedrin, and narcotics.  This will help reduce risk of rebound headaches. ?Be aware of common food triggers: ? - Caffeine:  coffee, black tea, cola, Mt. Dew ? - Chocolate ? - Dairy:  aged cheeses (brie, blue, cheddar, gouda, [place], provolone, [place], Swiss, etc), chocolate milk, buttermilk, sour cream, limit eggs and yogurt ? - Nuts, peanut butter ? - Alcohol ? - Cereals/grains:  FRESH breads (fresh bagels, sourdough, doughnuts), yeast productions ? - Processed/canned/aged/cured meats (pre-packaged deli meats, hotdogs) ? - MSG/glutamate:  soy sauce, flavor enhancer, pickled/preserved/marinated foods ? - Sweeteners:  aspartame (Equal, Nutrasweet).  Sugar and Splenda are okay ? - Vegetables:  legumes (lima beans, lentils, snow peas, fava beans, pinto peans, peas, garbanzo beans), sauerkraut, onions, olives, pickles ? - Fruit:  avocados, bananas, citrus fruit (orange, lemon, grapefruit), mango ? - Other:  Frozen meals, macaroni and cheese ?Routine exercise ?Stay adequately hydrated (aim for 64 oz water daily) ?Keep headache diary ?Maintain proper stress management ?Maintain proper sleep hygiene ?Do not skip meals ?Consider supplements:  magnesium citrate 400mg  daily, riboflavin 400mg  daily, coenzyme Q10 100mg  three times daily. ?Follow up 4-5 months ?We have sent a referral to [facility] for your MRI and they will call you directly to schedule your appointment.  They are located at [address] [facility]. If you need to contact them directly please call [phone].  ?

## 2021-11-23 ENCOUNTER — Encounter: Payer: Self-pay | Admitting: Family

## 2021-11-24 ENCOUNTER — Encounter: Payer: Self-pay | Admitting: Neurology

## 2021-12-07 ENCOUNTER — Telehealth: Payer: Self-pay

## 2021-12-07 MED ORDER — TOPIRAMATE 100 MG PO TABS: 100.0000 mg | ORAL_TABLET | Freq: Every day | ORAL | 0 refills | Status: DC

## 2021-12-07 NOTE — Telephone Encounter (Signed)
Patient advised of Dr.Jaffe note, ?I believe you said she has used up the 10 tablets of rizatriptan over 2 weeks and has not repeated a dose in a single day.  This means she has taken a rizatriptan for 10 days since her appointment which is too much.  It is too soon to refill it.  She must try not to take it more than 2 days out of the week.  We can increase topiramate to 100mg  at bedtime.  ?

## 2021-12-07 NOTE — Telephone Encounter (Signed)
Per patient she taken the Rizatriptan as needed like instructed. But she has maybe one pill left on her script. That she picked on 11/20/21. ? ?Advised patient her insurance may have denied the pick due to it hasn't been a full 30 days. ? ?Per pt she has to taken the rizatrtipan at least 2-3 days out of a week. ? ?Pt thinks the Topiramate may not be helping.  ?

## 2021-12-13 ENCOUNTER — Ambulatory Visit
Admission: RE | Admit: 2021-12-13 | Discharge: 2021-12-13 | Disposition: A | Discharge status: Home / Self Care | Payer: BC Managed Care – PPO | Source: Ambulatory Visit | Attending: Neurology | Admitting: Neurology

## 2021-12-13 IMAGING — MR MR HEAD WO/W CM
14 series · 48 of 48 positions shown · IV contrast (agent unspecified)
Comparison: No pertinent prior exams available for comparison.

CLINICAL DATA: Provided history: Intractable chronic migraine
without JUMPER and without status migrainosus. Chronic migraine
headache.

EXAM:
MRI HEAD WITHOUT AND WITH CONTRAST
TECHNIQUE: Multiplanar, multiecho pulse sequences of the brain and surrounding
structures were obtained without and with intravenous contrast.
CONTRAST:  7 mL Vueway intravenous contrast.

[Series 2: T1 · sagittal · 5.0mm · 0.45mm/px · 2 of 25 slices shown]
[im 1/25]
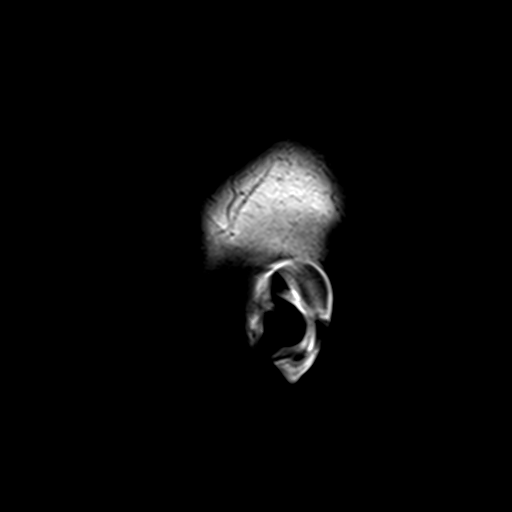
[im 25/25]
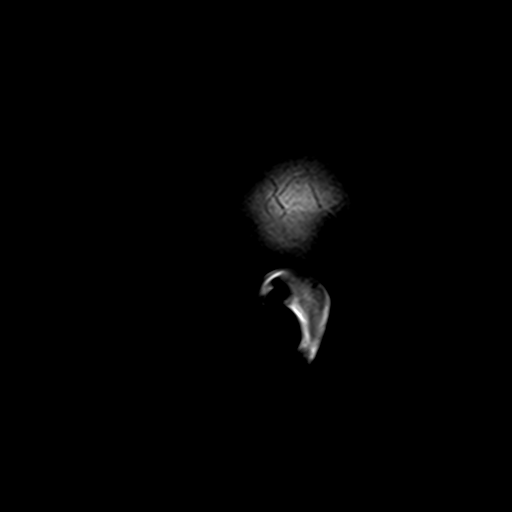

[Series 3: DWI · axial · 3.0mm · 1.80mm/px · z∈[-59,+94]mm · 6 of 104 slices shown (1 of 4)]
[im 1/104]
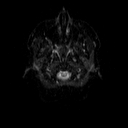
[im 21/104]
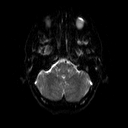
[im 42/104]
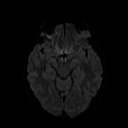
[im 62/104]
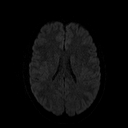
[im 83/104]
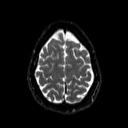
[im 104/104]
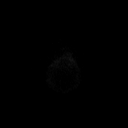

[Series 4: DWI · axial · 3.0mm · 1.80mm/px · z∈[-59,+94]mm · 3 of 48 slices shown (2 of 4)]
[im 1/48]
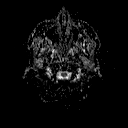
[im 24/48]
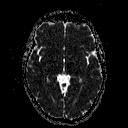
[im 48/48]
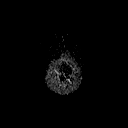

[Series 5: DWI · coronal · 5.0mm · 1.80mm/px · 5 of 74 slices shown (3 of 4)]
[im 1/74]
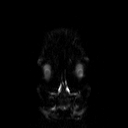
[im 19/74]
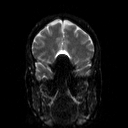
[im 37/74]
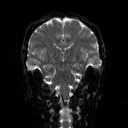
[im 55/74]
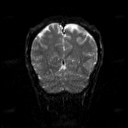
[im 74/74]
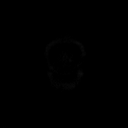

[Series 6: DWI · coronal · 5.0mm · 1.80mm/px · 2 of 37 slices shown (4 of 4)]
[im 1/37]
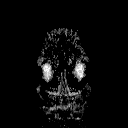
[im 37/37]
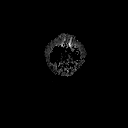

[Series 7: T2 · axial · 5.0mm · 0.60mm/px · 1 of 23 slices shown (1 of 2)]
[im 1/23]
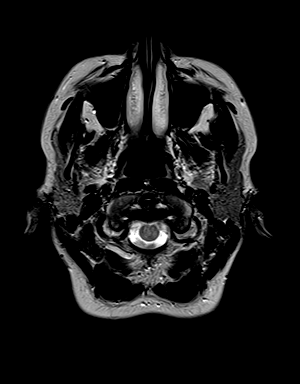

[Series 8: FLAIR · axial · 3.0mm · 0.45mm/px · z∈[-57,+96]mm · 2 of 34 slices shown]
[im 1/34]
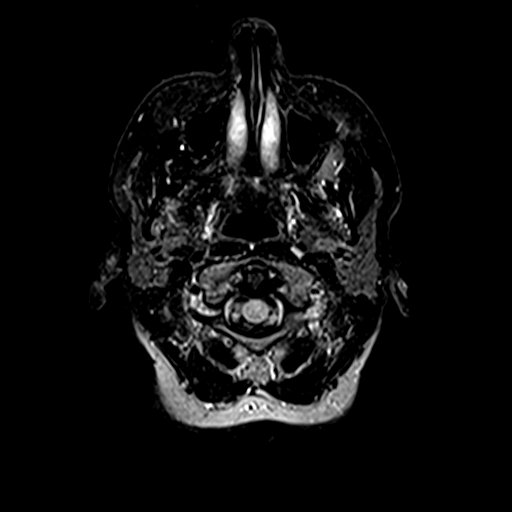
[im 34/34]
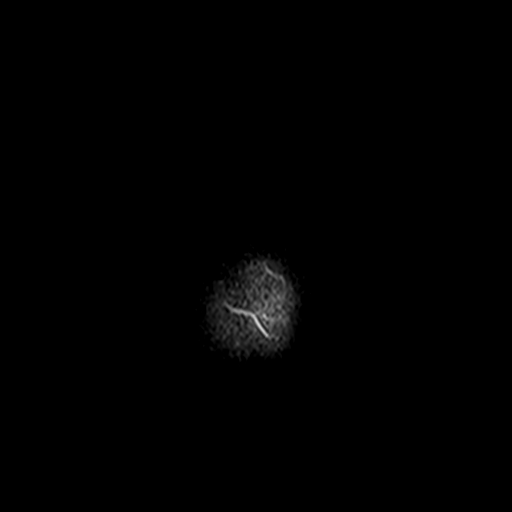

[Series 9: mip_images(sw) · axial · 32.0mm · 0.90mm/px · z∈[-46,+81]mm · 2 of 33 slices shown]
[im 1/33]
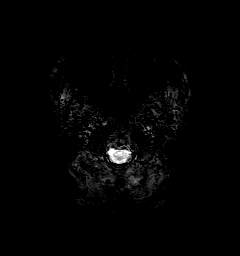
[im 33/33]
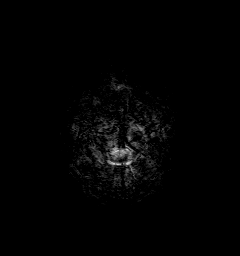

[Series 10: swi_images · axial · 4.0mm · 0.90mm/px · z∈[-60,+95]mm · 2 of 40 slices shown]
[im 1/40]
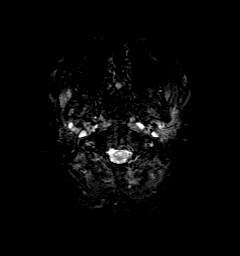
[im 40/40]
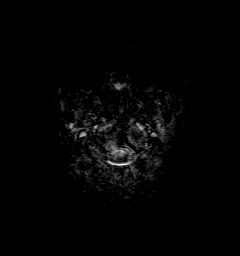

[Series 11: t1_mpr_tra · axial · 1.0mm · 0.75mm/px · z∈[-51,+91]mm · 9 of 144 slices shown]
[im 1/144]
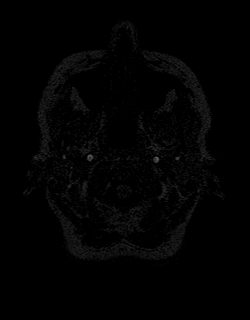
[im 18/144]
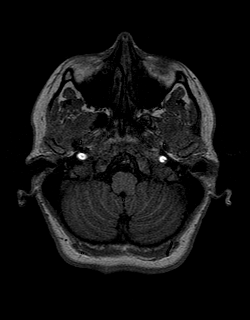
[im 36/144]
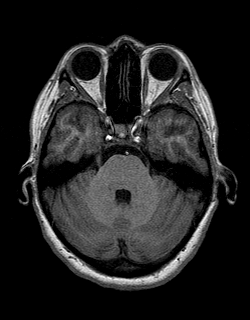
[im 54/144]
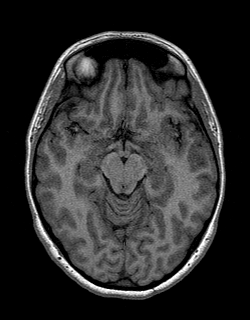
[im 72/144]
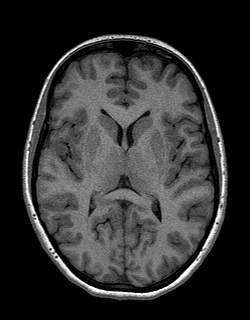
[im 90/144]
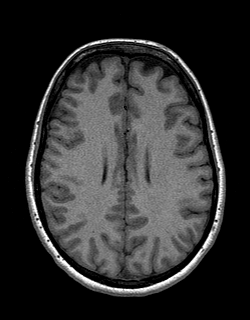
[im 108/144]
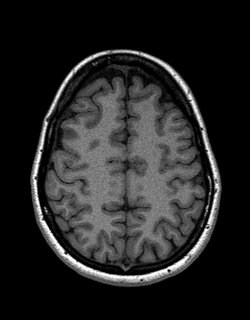
[im 126/144]
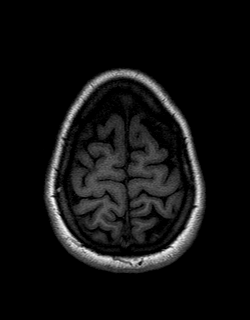
[im 144/144]
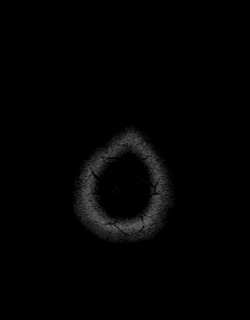

[Series 12: T2 · coronal · 5.0mm · 0.45mm/px · 2 of 27 slices shown (2 of 2)]
[im 1/27]
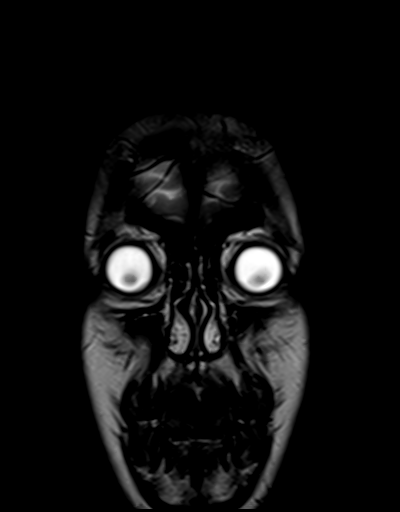
[im 27/27]
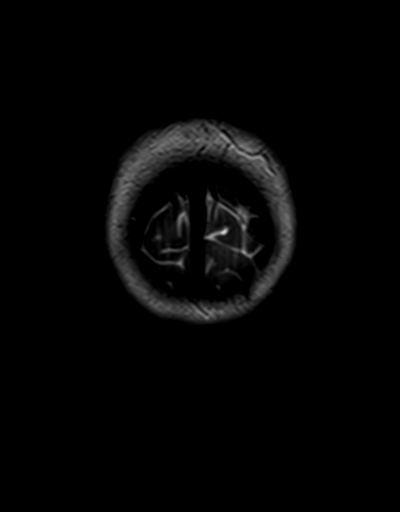

[Series 13: t1_mpr_tra post · axial · 1.0mm · 0.75mm/px · z∈[-51,+91]mm · 9 of 144 slices shown]
[im 1/144]
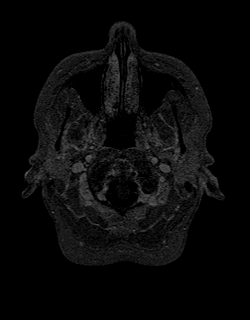
[im 18/144]
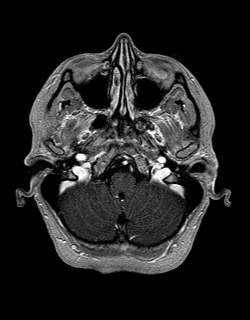
[im 36/144]
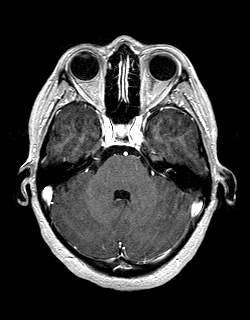
[im 54/144]
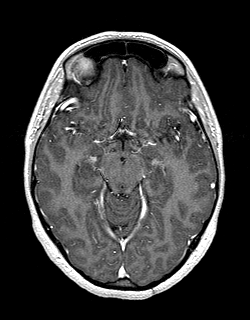
[im 72/144]
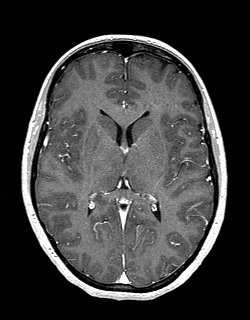
[im 90/144]
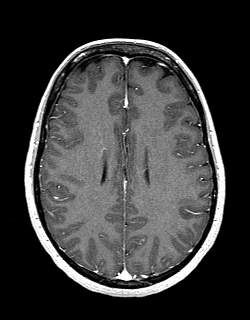
[im 108/144]
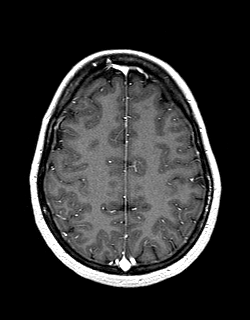
[im 126/144]
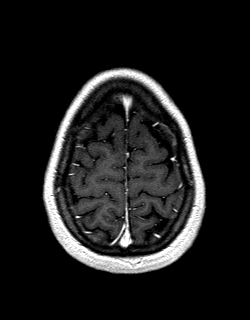
[im 144/144]
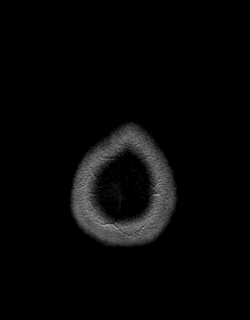

[Series 14: post cor · coronal · 5.0mm · 0.45mm/px · 2 of 27 slices shown]
[im 1/27]
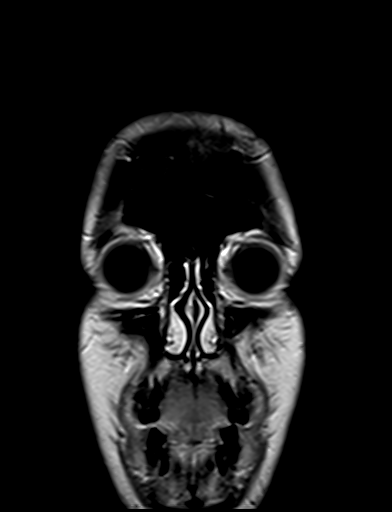
[im 27/27]
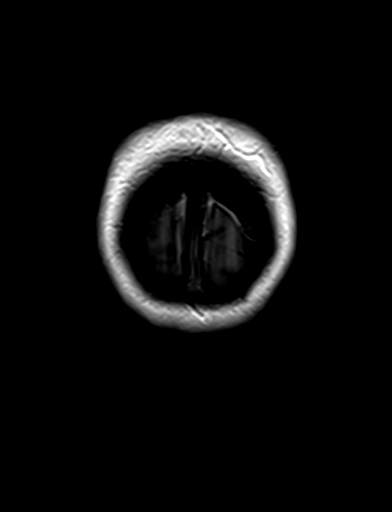

[Series 15: T1 post-contrast · sagittal · 5.0mm · 0.45mm/px · 1 of 23 slices shown]
[im 1/23]
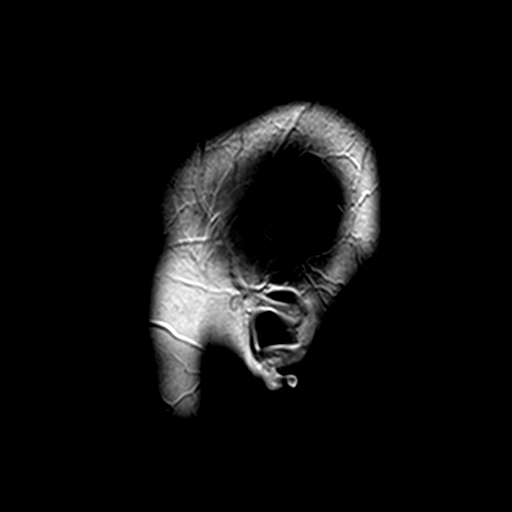

[48 of 48 positions shown; findings below may reference images not displayed]

FINDINGS: Brain:

Cerebral volume is normal.

3 mm focus of susceptibility weighted signal loss within the left
parietal lobe with associated T2 FLAIR hyperintense signal
abnormality and punctate enhancement (series 10, image 32) (series
8, image 27) (series 13, image 118).

No other focal parenchymal signal abnormality is identified.

There is no acute infarct.

No evidence of an intracranial mass.

No chronic intracranial blood products.

No extra-axial fluid collection.

No midline shift.

No pathologic intracranial enhancement identified.

Vascular: Maintained flow voids within the proximal large arterial
vessels.

Skull and upper cervical spine: No focal suspicious marrow lesion.

Sinuses/Orbits: No mass or acute finding within the imaged orbits.
Small mucous retention cyst within the left sphenoid sinus.

Impression #1 will be called to the ordering clinician or
representative by the Radiologist Assistant, and communication
documented in the PACS or [REDACTED].
IMPRESSION: 3 mm focus of signal abnormality and punctate enhancement within the
left parietal lobe, as described. While this may reflect a
cavernoma, alternative etiologies (such as an isolated intracranial
metastasis) are possible. An initial short-interval 6 week follow-up
brain MRI without and with contrast is recommended for surveillance
of this finding.

Otherwise unremarkable MRI appearance of the brain.

Small mucous retention cyst within the left sphenoid sinus.

## 2021-12-13 MED ORDER — GADOPICLENOL 0.5 MMOL/ML IV SOLN
7.0000 mL | Freq: Once | INTRAVENOUS | Status: AC | PRN
  Administered 2021-12-13: 7 mL via INTRAVENOUS

## 2021-12-14 ENCOUNTER — Telehealth: Payer: Self-pay | Admitting: Neurology

## 2021-12-14 ENCOUNTER — Telehealth: Payer: Self-pay

## 2021-12-14 DIAGNOSIS — G8929 Other chronic pain: Secondary | ICD-10-CM

## 2021-12-14 DIAGNOSIS — M25511 Pain in right shoulder: Secondary | ICD-10-CM

## 2021-12-14 DIAGNOSIS — R9089 Other abnormal findings on diagnostic imaging of central nervous system: Secondary | ICD-10-CM

## 2021-12-14 NOTE — Telephone Encounter (Signed)
MRI Arlys John ordered and Referral for Sports Medicine ordered.  ?

## 2021-12-14 NOTE — Telephone Encounter (Signed)
Telephone call from [facility] advised Abnormal MRI finding ready for viewing. ? ?Please review.  ?

## 2021-12-14 NOTE — Telephone Encounter (Signed)
I called and spoke with patient about MRI results.  There is a punctate focus in the left parietal lobe.  Would like to repeat MRI of brain with and without contrast in 6 weeks to follow up (for abnormal finding on MRI).  She reports headaches are definitely related to posture.  She has back and shoulder pain.  If she wears a back brace or does yoga, headaches resolve.  I would like to refer her to Sports Medicine, Dr. Richardean Sale or Dr. Antoine Primas, who also perform OMT.   ?

## 2021-12-14 NOTE — Addendum Note (Signed)
Addended by: Leida Lauth on: 12/14/2021 03:31 PM ? ? Modules accepted: Orders ? ?

## 2021-12-15 ENCOUNTER — Encounter: Payer: Self-pay | Admitting: Neurology

## 2021-12-15 ENCOUNTER — Ambulatory Visit: Payer: BC Managed Care – PPO | Admitting: Neurology

## 2021-12-15 NOTE — Progress Notes (Signed)
[facility] [address] [place] [address] Phone: [phone]   Assessment and Plan:     1. Migraine without aura, intractable, without status migrainosus 2. Chronic tension-type headache, not intractable 3. Neck pain on left side 4. Somatic dysfunction of cervical region 5. Somatic dysfunction of thoracic region 6. Somatic dysfunction of lumbar region 7. Somatic dysfunction of pelvic region 8. Somatic dysfunction of rib region -Chronic with exacerbation, initial sports medicine visit - Chronic headaches of unknown etiology with patient not having significant improvement with abortive migraine medication, but has had some relief with yoga and posture brace, suggesting that there is a musculoskeletal component to her headaches - Patient felt that tizanidine was overall ineffective, so discontinue tizanidine, and instead can use Flexeril 5 to 10 mg as needed for muscle spasms.  New prescription provided - Patient elected for initial OMT today.  Tolerated well per note below. - Decision today to treat with OMT was based on Physical Exam   After verbal consent patient was treated with HVLA (high velocity low amplitude), ME (muscle energy), FPR (flex positional release), ST (soft tissue), PC/PD (Pelvic Compression/ Pelvic Decompression) techniques in cervical, rib, thoracic, lumbar, and pelvic areas. Patient tolerated the procedure well with improvement in symptoms.  Patient educated on potential side effects of soreness and recommended to rest, hydrate, and use Tylenol as needed for pain control.   Pertinent previous records reviewed include neurology note 11/21/2021, neurology patient message 12/15/2021, brain MRI 12/13/2021   Follow Up: 2 weeks for reevaluation.  Could repeat OMT if patient found it beneficial.  Could trial course of daily NSAIDs.  Could obtain thoracic and cervical x-rays if no improvement or worsening of symptoms   Subjective:    I, Amy Stanton, am serving as a Neurosurgeon for Amy Stanton  Chief Complaint: back and shoulder pain   HPI:  12/16/2021 Patient is a 31 year old female complaining of back and shoulder pain. Patient states that she has headaches hasnt really responded to much , thinks it might be tension headaches , uses a posture brace that helps with headaches  is an avid gamer, interested in OMT , shoulder radiates to head and neck left sided pain , yoga has really helped with shoulder pain and headaches   Relevant Historical Information: Chronic headaches  Additional pertinent review of systems negative.  Current Outpatient Medications  Medication Sig Dispense Refill   cyclobenzaprine (FLEXERIL) 5 MG tablet Take 1 tablet (5 mg total) by mouth at bedtime as needed for muscle spasms. 15 tablet 0   ibuprofen (ADVIL) 800 MG tablet Take 1 tablet (800 mg total) by mouth every 8 (eight) hours as needed. 30 tablet 1   loratadine (CLARITIN) 10 MG tablet Take 10 mg by mouth daily.     ondansetron (ZOFRAN) 8 MG tablet Take 1 tablet (8 mg total) by mouth every 8 (eight) hours as needed for nausea or vomiting. 20 tablet 5   rizatriptan (MAXALT) 10 MG tablet Take 1 tablet earliest onset of headache.  May repeat in 2 hours if needed.  Maximum 2 tablets in 24 hours. 10 tablet 5   topiramate (TOPAMAX) 100 MG tablet Take 1 tablet (100 mg total) by mouth at bedtime. 30 tablet 0   No current facility-administered medications for this visit.      Objective:     Vitals:   12/16/21 0952  BP: 110/72  Pulse: 70  SpO2: 99%  Weight: 152 lb (68.9  kg)  Height: 5\' 2"  (1.575 m)      Body mass index is 27.8 kg/m.    Physical Exam:     General: Well-appearing, cooperative, sitting comfortably in no acute distress.   OMT Physical Exam:  ASIS Compression Test: Positive Right Cervical: TTP paraspinal, C3 RRSL Rib: Left elevated first rib with TTP Thoracic: TTP paraspinal, L2-5 RLSR Lumbar: TTP paraspinal,  L1-3 RRSL, L5 SL Pelvis: Right anterior innominate  Electronically signed by:  [facility] 11:19 AM 12/16/21

## 2021-12-16 ENCOUNTER — Ambulatory Visit (INDEPENDENT_AMBULATORY_CARE_PROVIDER_SITE_OTHER): Payer: BC Managed Care – PPO | Admitting: Sports Medicine

## 2021-12-16 VITALS — BP 110/72 | HR 70 | Ht 62.0 in | Wt 152.0 lb

## 2021-12-16 DIAGNOSIS — M9901 Segmental and somatic dysfunction of cervical region: Secondary | ICD-10-CM

## 2021-12-16 DIAGNOSIS — M542 Cervicalgia: Secondary | ICD-10-CM

## 2021-12-16 DIAGNOSIS — M9905 Segmental and somatic dysfunction of pelvic region: Secondary | ICD-10-CM

## 2021-12-16 DIAGNOSIS — G43019 Migraine without aura, intractable, without status migrainosus: Secondary | ICD-10-CM

## 2021-12-16 DIAGNOSIS — M9903 Segmental and somatic dysfunction of lumbar region: Secondary | ICD-10-CM | POA: Diagnosis not present

## 2021-12-16 DIAGNOSIS — M9908 Segmental and somatic dysfunction of rib cage: Secondary | ICD-10-CM

## 2021-12-16 DIAGNOSIS — M9902 Segmental and somatic dysfunction of thoracic region: Secondary | ICD-10-CM

## 2021-12-16 DIAGNOSIS — G44229 Chronic tension-type headache, not intractable: Secondary | ICD-10-CM | POA: Diagnosis not present

## 2021-12-16 MED ORDER — CYCLOBENZAPRINE HCL 5 MG PO TABS: 5.0000 mg | ORAL_TABLET | Freq: Every evening | ORAL | 0 refills | Status: AC | PRN

## 2021-12-16 NOTE — Patient Instructions (Addendum)
Good to see you  Trap and neck HEP Flexeril 5-10 mg as needed for muscle spasm 15 0 2 week follow up

## 2021-12-30 NOTE — Progress Notes (Signed)
[facility] [address] [place] [address] Phone: [phone]   Assessment and Plan:     1. Chronic tension-type headache, not intractable 2. Neck pain on left side 3. Somatic dysfunction of cervical region 4. Somatic dysfunction of thoracic region 5. Somatic dysfunction of rib region -Chronic with exacerbation, subsequent visit - Overall improvement in upper back and neck pain with 2 episodes of headaches since initial office visit where we performed OMT, and started Flexeril 5 mg nightly as needed, supporting that there is a muscular component to patient's chronic headaches - May increase to Flexeril 10 mg nightly as needed for muscle spasms as 5 mg dose is often ineffective - May continue ibuprofen 800 mg as needed, which patient has previously used for menstrual cramping - Continue HEP for upper back and neck - Start physical therapy for upper back and neck.  Referral sent - Patient has received significant relief with OMT in the past.  Elects for repeat OMT today.  Tolerated well per note below. - Decision today to treat with OMT was based on Physical Exam  After verbal consent patient was treated with HVLA (high velocity low amplitude), ME (muscle energy), FPR (flex positional release), ST (soft tissue), PC/PD (Pelvic Compression/ Pelvic Decompression) techniques in cervical, rib, thoracic,   areas. Patient tolerated the procedure well with improvement in symptoms.  Patient educated on potential side effects of soreness and recommended to rest, hydrate, and use Tylenol as needed for pain control.   Pertinent previous records reviewed include none   Follow Up: 3 weeks for reevaluation after starting PT.  Could repeat OMT.  If no improvement or worsening of symptoms, could consider C-spine and T-spine x-ray   Subjective:   I, Amy Stanton, am serving as a Neurosurgeon for Doctor Richardean Sale  Chief Complaint: back and  shoulder pain    HPI:  12/16/2021 Patient is a 31 year old female complaining of back and shoulder pain. Patient states that she has headaches hasnt really responded to much , thinks it might be tension headaches , uses a posture brace that helps with headaches  is an avid gamer, interested in OMT , shoulder radiates to head and neck left sided pain , yoga has really helped with shoulder pain and headaches   01/02/2022 Patient states that she went on a road trip and she was slacking on HEP is kind of achy , is feeling a lot of neck tension and shoulder tension, headaches have started to die down hasn/'t had to use as much medication the HEP for the neck has really helped    Relevant Historical Information: Chronic headaches  Additional pertinent review of systems negative.   Current Outpatient Medications:    cyclobenzaprine (FLEXERIL) 5 MG tablet, Take 1 tablet (5 mg total) by mouth at bedtime as needed for muscle spasms., Disp: 15 tablet, Rfl: 0   ibuprofen (ADVIL) 800 MG tablet, Take 1 tablet (800 mg total) by mouth every 8 (eight) hours as needed., Disp: 30 tablet, Rfl: 1   loratadine (CLARITIN) 10 MG tablet, Take 10 mg by mouth daily., Disp: , Rfl:    ondansetron (ZOFRAN) 8 MG tablet, Take 1 tablet (8 mg total) by mouth every 8 (eight) hours as needed for nausea or vomiting., Disp: 20 tablet, Rfl: 5   rizatriptan (MAXALT) 10 MG tablet, Take 1 tablet earliest onset of headache.  May repeat in 2 hours if needed.  Maximum 2 tablets in 24  hours., Disp: 10 tablet, Rfl: 5   topiramate (TOPAMAX) 100 MG tablet, Take 1 tablet (100 mg total) by mouth at bedtime., Disp: 30 tablet, Rfl: 0   Objective:     Vitals:   01/02/22 1252  BP: 110/72  Pulse: 74  SpO2: 98%  Weight: 151 lb (68.5 kg)  Height: 5\' 2"  (1.575 m)      Body mass index is 27.62 kg/m.    Physical Exam:    General: Well-appearing, cooperative, sitting comfortably in no acute distress.   OMT Physical Exam:   Cervical: TTP  paraspinal, C3-5 RL SL Rib: Left elevated first rib with TTP Thoracic: TTP paraspinal, T4 RRSR, T7 RRSR TTP bilateral trapezius     Electronically signed by:  [facility] 1:24 PM 01/02/22

## 2022-01-02 ENCOUNTER — Ambulatory Visit (INDEPENDENT_AMBULATORY_CARE_PROVIDER_SITE_OTHER): Payer: BC Managed Care – PPO | Admitting: Sports Medicine

## 2022-01-02 VITALS — BP 110/72 | HR 74 | Ht 62.0 in | Wt 151.0 lb

## 2022-01-02 DIAGNOSIS — M542 Cervicalgia: Secondary | ICD-10-CM

## 2022-01-02 DIAGNOSIS — M9902 Segmental and somatic dysfunction of thoracic region: Secondary | ICD-10-CM | POA: Diagnosis not present

## 2022-01-02 DIAGNOSIS — G44229 Chronic tension-type headache, not intractable: Secondary | ICD-10-CM | POA: Diagnosis not present

## 2022-01-02 DIAGNOSIS — M9908 Segmental and somatic dysfunction of rib cage: Secondary | ICD-10-CM

## 2022-01-02 DIAGNOSIS — M9901 Segmental and somatic dysfunction of cervical region: Secondary | ICD-10-CM

## 2022-01-02 NOTE — Patient Instructions (Addendum)
Good to see you  PT referral May increase to flexeril 10 mg nightly as needed contact us if you need a refill  Continue HEP  3 week follow up

## 2022-01-03 ENCOUNTER — Other Ambulatory Visit: Payer: BC Managed Care – PPO

## 2022-01-09 ENCOUNTER — Encounter: Payer: Self-pay | Admitting: Neurology

## 2022-01-09 ENCOUNTER — Other Ambulatory Visit: Payer: Self-pay | Admitting: Neurology

## 2022-01-13 ENCOUNTER — Ambulatory Visit
Admission: RE | Admit: 2022-01-13 | Discharge: 2022-01-13 | Disposition: A | Discharge status: Home / Self Care | Payer: BC Managed Care – PPO | Source: Ambulatory Visit | Attending: Neurology | Admitting: Neurology

## 2022-01-13 DIAGNOSIS — R9089 Other abnormal findings on diagnostic imaging of central nervous system: Secondary | ICD-10-CM

## 2022-01-13 IMAGING — MR MR HEAD WO/W CM
13 series · 48 of 48 positions shown · IV contrast (multihance)
Comparison: Head MRI [DATE]

CLINICAL DATA: Follow-up abnormal brain MRI.

EXAM:
MRI HEAD WITHOUT AND WITH CONTRAST
TECHNIQUE: Multiplanar, multiecho pulse sequences of the brain and surrounding
structures were obtained without and with intravenous contrast.
CONTRAST:  14mL MULTIHANCE GADOBENATE DIMEGLUMINE 529 MG/ML IV SOLN

[Series 2: T1 · sagittal · 5.0mm · 0.45mm/px · 1 of 21 slices shown]
[im 1/21]
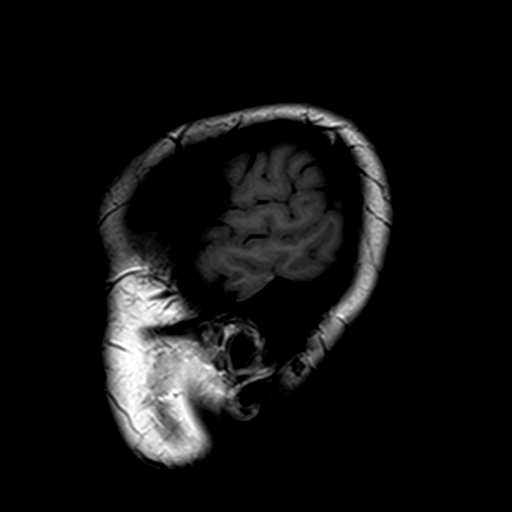

[Series 3: DWI · axial · 3.0mm · 1.80mm/px · z∈[-52,+95]mm · 7 of 99 slices shown (1 of 4)]
[im 1/99]
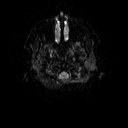
[im 17/99]
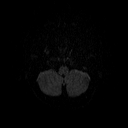
[im 33/99]
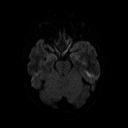
[im 50/99]
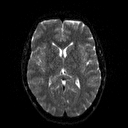
[im 66/99]
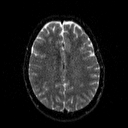
[im 82/99]
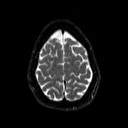
[im 99/99]
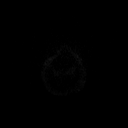

[Series 4: DWI · axial · 3.0mm · 1.80mm/px · z∈[-52,+95]mm · 3 of 49 slices shown (2 of 4)]
[im 1/49]
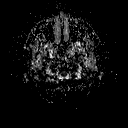
[im 25/49]
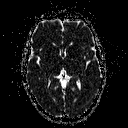
[im 49/49]
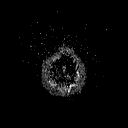

[Series 5: DWI · coronal · 5.0mm · 1.80mm/px · 5 of 68 slices shown (3 of 4)]
[im 1/68]
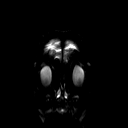
[im 17/68]
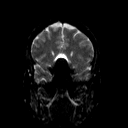
[im 34/68]
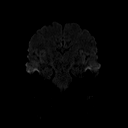
[im 51/68]
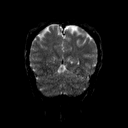
[im 68/68]
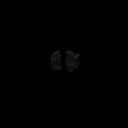

[Series 6: DWI · coronal · 5.0mm · 1.80mm/px · 2 of 34 slices shown (4 of 4)]
[im 1/34]
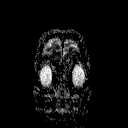
[im 34/34]
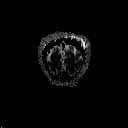

[Series 7: T2 · axial · 5.0mm · 0.72mm/px · 1 of 22 slices shown (1 of 2)]
[im 1/22]
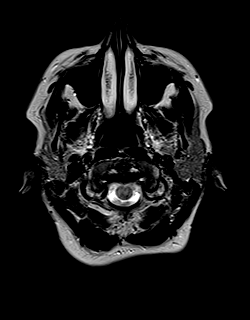

[Series 8: FLAIR · axial · 3.0mm · 0.45mm/px · z∈[-46,+88]mm · 2 of 30 slices shown]
[im 1/30]
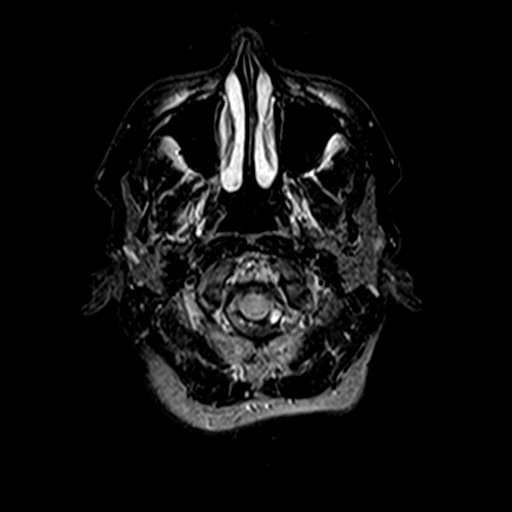
[im 30/30]
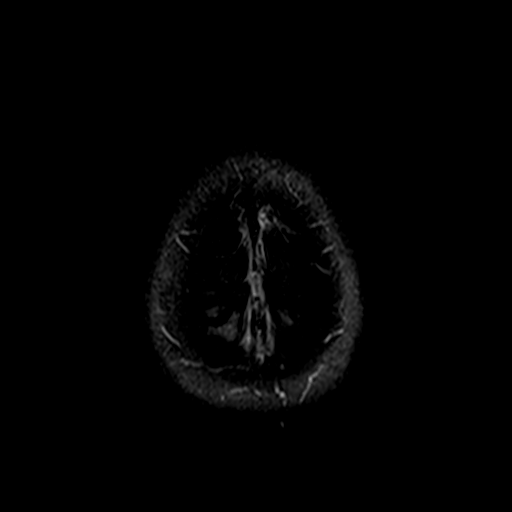

[Series 10: swi_images · axial · 4.0mm · 0.90mm/px · z∈[-49,+91]mm · 2 of 36 slices shown]
[im 1/36]
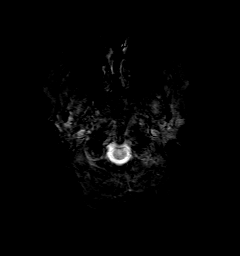
[im 36/36]
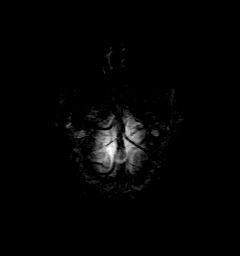

[Series 11: t1_mpr_tra · axial · 1.0mm · 0.75mm/px · z∈[-50,+93]mm · 10 of 144 slices shown]
[im 1/144]
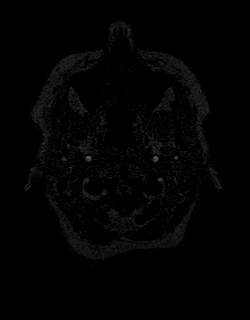
[im 16/144]
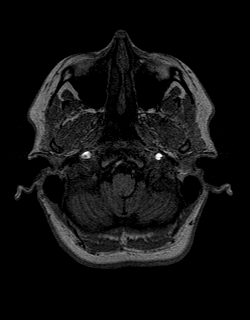
[im 32/144]
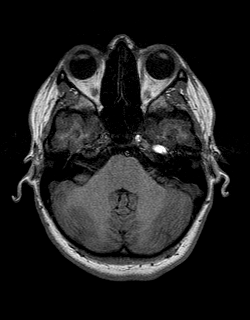
[im 48/144]
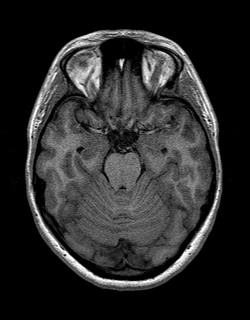
[im 64/144]
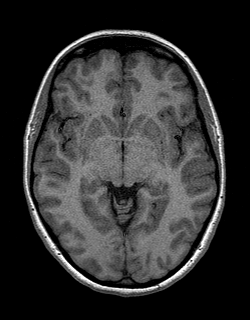
[im 80/144]
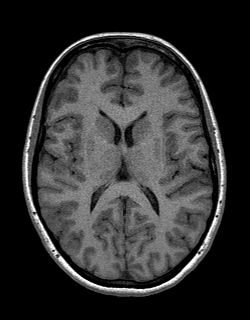
[im 96/144]
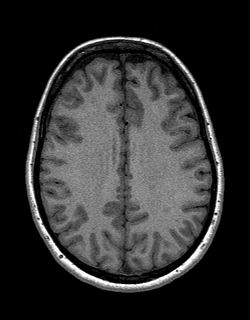
[im 112/144]
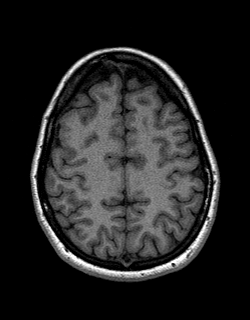
[im 128/144]
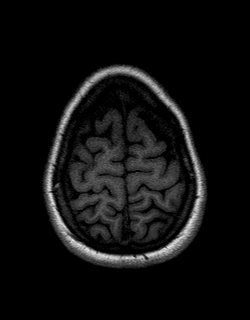
[im 144/144]
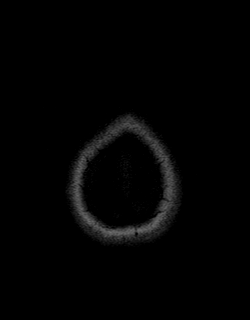

[Series 12: T2 · coronal · 5.0mm · 0.45mm/px · 2 of 25 slices shown (2 of 2)]
[im 1/25]
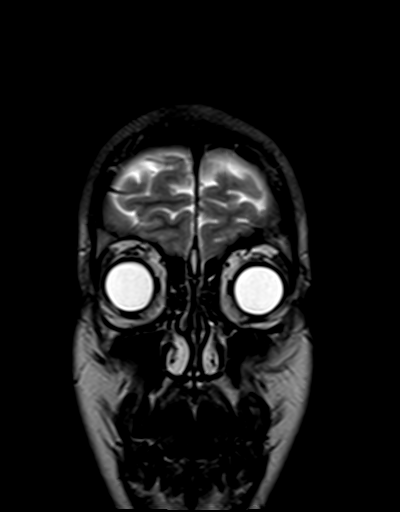
[im 25/25]
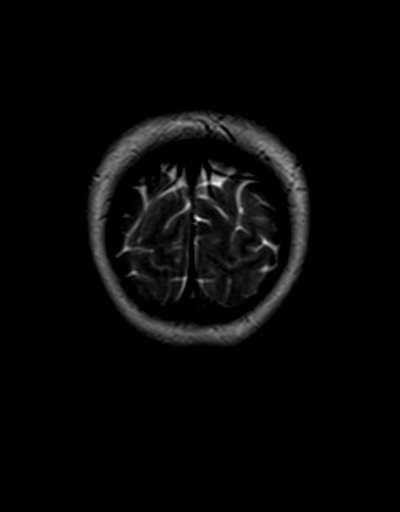

[Series 13: t1_mpr_tra post · axial · 1.0mm · 0.75mm/px · z∈[-50,+93]mm · 10 of 144 slices shown]
[im 1/144]
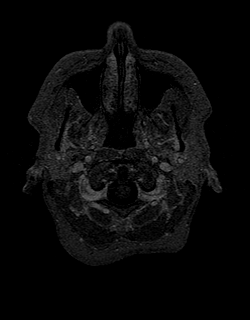
[im 16/144]
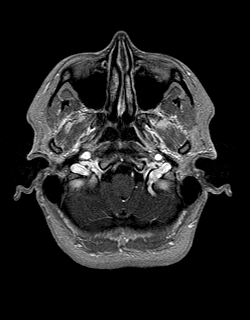
[im 32/144]
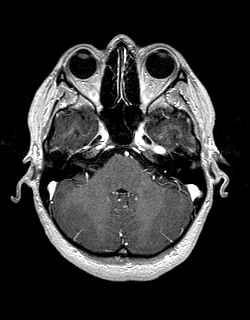
[im 48/144]
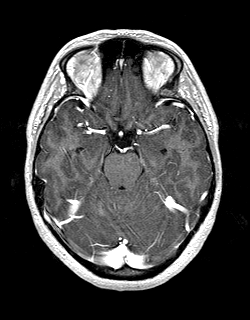
[im 64/144]
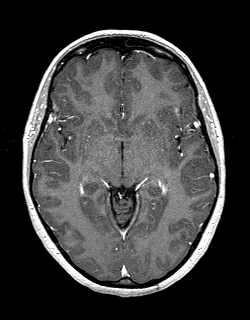
[im 80/144]
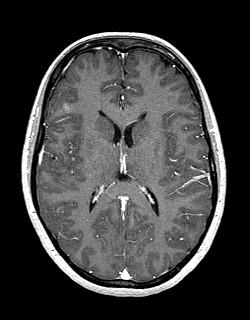
[im 96/144]
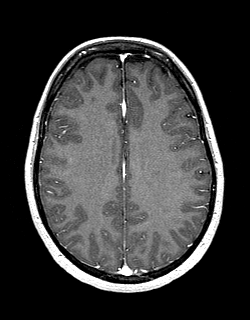
[im 112/144]
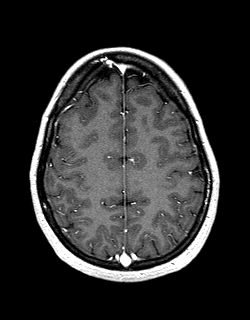
[im 128/144]
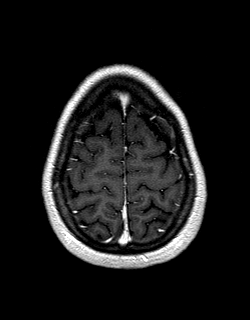
[im 144/144]
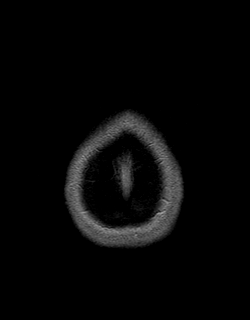

[Series 14: post cor · coronal · 5.0mm · 0.45mm/px · 2 of 25 slices shown]
[im 1/25]
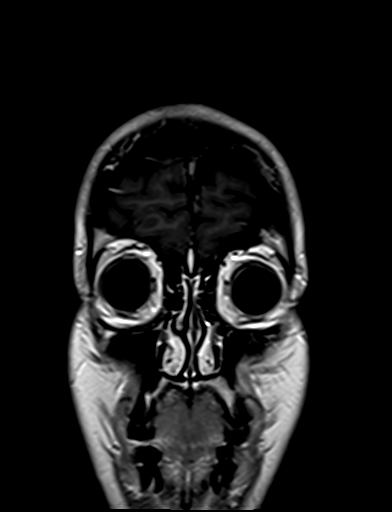
[im 25/25]
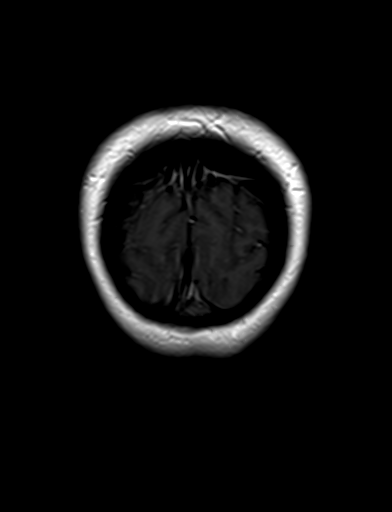

[Series 15: T1 post-contrast · sagittal · 5.0mm · 0.45mm/px · 1 of 21 slices shown]
[im 1/21]
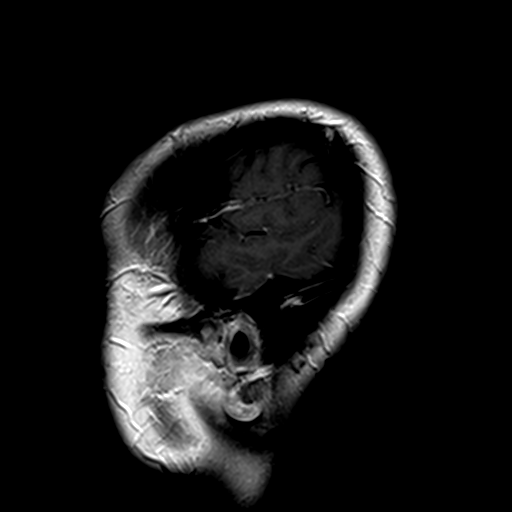

[48 of 48 positions shown; findings below may reference images not displayed]

FINDINGS: Brain: There is no evidence of an acute infarct, acute intracranial
hemorrhage, midline shift, or extra-axial fluid collection. The
ventricles and sulci are normal. A 3 mm lesion in the high left
parietal lobe demonstrating central T2 hyperintensity, peripheral T2
hypointensity, susceptibility artifact, and punctate enhancement is
unchanged. There is no associated edema. The brain is normal in
signal elsewhere, and no other abnormal intracranial enhancement is
identified.

Vascular: Major intracranial vascular flow voids are preserved.

Skull and upper cervical spine: Unremarkable bone marrow signal.

Sinuses/Orbits: Unremarkable orbits. Paranasal sinuses and mastoid
air cells are clear.

Other: None.
IMPRESSION: 1. Unchanged 3 mm focus of signal abnormality in the left parietal
lobe most consistent with a benign cavernoma.
2. Otherwise unremarkable appearance of the brain.

## 2022-01-13 MED ORDER — GADOBENATE DIMEGLUMINE 529 MG/ML IV SOLN
14.0000 mL | Freq: Once | INTRAVENOUS | Status: AC | PRN
  Administered 2022-01-13: 14 mL via INTRAVENOUS

## 2022-01-16 NOTE — Therapy (Unsigned)
OUTPATIENT PHYSICAL THERAPY CERVICAL EVALUATION   Patient Name: Amy Stanton MRN: [id number] DOB:1991/05/21, 31 y.o., female Today's Date: 01/17/2022   PT End of Session - 01/17/22 0933     Visit Number 1    Number of Visits 16    Date for PT Re-Evaluation 03/14/22    Authorization Type [employer]    PT Start Time [phone]    PT Stop Time 0931    PT Time Calculation (min) 54 min    Activity Tolerance Patient tolerated treatment well    Behavior During Therapy [facility] for tasks assessed/performed             Past Medical History:  Diagnosis Date   Depression    Past Surgical History:  Procedure Laterality Date   BILATERAL SALPINGECTOMY Bilateral 02/08/2021   pt chose to do this for suspected endometrosis   Patient Active Problem List   Diagnosis Date Noted   Dysmenorrhea 10/19/2021   Migraine without aura, intractable, without status migrainosus 09/26/2021   Neck pain on left side 09/26/2021   Nonintractable headache 09/15/2021   Chronic tension-type headache, not intractable 09/15/2021   Screening for cervical cancer 09/15/2021    PCP: Mort Sawyers, FNP  REFERRING PROVIDER: Richardean Sale, DO  REFERRING DIAG: Neck pain on left side [M54.2], Somatic dysfunction of cervical region [M99.01], Somatic dysfunction of thoracic region [M99.02], Somatic dysfunction of lumbar region [M99.03], Somatic dysfunction of pelvic region [M99.05], Somatic dysfunction of rib region [M99.08], Chronic tension-type headache, not intractable [G44.229]  THERAPY DIAG:  Cervicalgia  Acute pain of left shoulder  Pain in thoracic spine  Rationale for Evaluation and Treatment Rehabilitation  ONSET DATE: Headaches 1-2 years. Cervical/thoracic pain 2-3 months.   SUBJECTIVE:                                                                                                                                                                                                         SUBJECTIVE  STATEMENT: Pt is a 31 y/o F that presents with cervical/ thoracic  and L shoulder pain that started about 2-3 months ago. She reports intermittent headaches for 1-2 years. She states that she plays a lot of video games and drives a lot causing her L shoulder and neck to get more painful. Pt reports radiating pain from her shoulder into her L UT and neck. Pt states that she started yoga which has been beneficial, but she has not been consistent with it. Her MD provided her with wall angles, and UT stretch which has been very beneficial for her pain.  PERTINENT HISTORY:  Depression  PAIN:  Are you having pain? Yes: NPRS scale: 5/10 Pain location: L shoulder into cervical/ thoracic spine.  Pain description: Dull ache that turns into sharp pain.  Aggravating factors: Driving, video gaming, carrying, lifting OH, sleeping.  Relieving factors: Exercises prescribed by MD, utilization of pillow, heat,  meds, standing while working.   PRECAUTIONS: None  WEIGHT BEARING RESTRICTIONS No  FALLS:  Has patient fallen in last 6 months? No  LIVING ENVIRONMENT: Lives with: lives with their family Lives in: House/apartment Stairs: No Has following equipment at home: None  OCCUPATION: Housewife.   PLOF: Independent  PATIENT GOALS Pt would like to reduce pain in shoulder.   OBJECTIVE:   DIAGNOSTIC FINDINGS:  12/13/2021:   3 mm focus of signal abnormality and punctate  enhancement within the left parietal lobe, as described. While this may reflect a cavernoma, alternative etiologies (such as an isolated intracranial metastasis) are possible. An initial short-interval 6 week follow-up brain MRI without and with contrast is recommended for surveillance of this finding. Otherwise unremarkable MRI appearance of the brain. Small mucous retention cyst within the left sphenoid sinus.  01/13/2022:  1. Unchanged 3 mm focus of signal abnormality in the l eft parietal lobe most consistent with a benign  cavernoma. 2. Otherwise unremarkable appearance of the brain.  PATIENT SURVEYS:  FOTO Initial 42, predicted 31   COGNITION: Overall cognitive status: Within functional limits for tasks assessed   SENSATION: WFL  POSTURE: rounded shoulders and forward head  PALPATION: L UT musculature,    CERVICAL ROM:   Active ROM A(deg) eval  Flexion WFL tight  Extension WFL  Right lateral flexion WFL  Left lateral flexion WFL  Right rotation WFL  Left rotation WFL   (Blank rows = not tested)  UPPER EXTREMITY ROM:  Active ROM Right eval Left eval  Shoulder flexion [facility] WFL with crepitus reported  Shoulder extension [facility]  Shoulder abduction [facility]  Shoulder extension [facility]  Shoulder internal rotation [facility]  Shoulder external rotation WFL WFL   (Blank rows = not tested)  UPPER EXTREMITY MMT:  MMT Right eval Left eval  Shoulder flexion 4+ 4+  Shoulder extension    Shoulder abduction 5 5  Shoulder internal rotation 5 5  Shoulder external rotation 4+ 4  Middle trapezius 4- 4-  Lower trapezius 4- 4-  Elbow flexion 5 5  Elbow extension 5 5   (Blank rows = not tested)  CERVICAL SPECIAL TESTS:  Upper limb tension test (ULTT): Positive L side only  TODAY'S TREATMENT:  See HEP below.   PATIENT EDUCATION:  Education details: Educated pt on anatomy and physiology of current symptoms, FOTO, diagnosis, prognosis, HEP,  and POC.  Person educated: Patient Education method: Explanation, Demonstration, Tactile cues, Verbal cues, and Handouts Education comprehension: verbalized understanding   HOME EXERCISE PROGRAM: Access Code: [facility] URL: [email or web address] Date: 01/17/2022 Prepared by: Royal Hawthorn  Exercises - Seated Scapular Retraction  - 2 x daily - 7 x weekly - 2 sets - 10 reps - Shoulder External Rotation and Scapular Retraction with Resistance  - 2 x daily - 7 x weekly - 2 sets - 10 reps - Supine Chin Tuck  - 2 x daily - 7 x weekly - 2  sets - 10 reps - Prone Press Up On Elbows  - 1 x daily - 7 x weekly - 3 sets - 10 reps - Doorway Pec Stretch at 60 Elevation  - 2 x daily -  7 x weekly - 2 sets - 4 reps  ASSESSMENT:  CLINICAL IMPRESSION: Patient is a 31 y.o. F who was seen today for physical therapy evaluation and treatment for neck pain, L shoulder and thoracic pain with intermittent headaches. She reports non compliance with exercises provided by MD despite relief noted.   OBJECTIVE IMPAIRMENTS decreased endurance, decreased strength, increased muscle spasms, impaired UE functional use, improper body mechanics, postural dysfunction, and pain.   ACTIVITY LIMITATIONS carrying, lifting, sleeping, and reach over head  PARTICIPATION LIMITATIONS: meal prep, cleaning, laundry, interpersonal relationship, driving, and shopping  PERSONAL FACTORS Fitness, Past/current experiences, Profession, and 1 comorbidity: Depression  are also affecting patient's functional outcome.   REHAB POTENTIAL: Good  CLINICAL DECISION MAKING: Stable/uncomplicated  EVALUATION COMPLEXITY: Low   GOALS: Goals reviewed with patient? No  SHORT TERM GOALS: Target date: 02/14/2022   Pt will be I and compliant with initial HEP. Baseline: provided at eval Goal status: INITIAL  2.  Pt will report a decrease in baseline pain to 4/10.  Baseline:  Goal status: INITIAL 4.  Pt will report 50% less headaches throughout the day.  Baseline:  Goal status: INITIAL   LONG TERM GOALS: Target date: 03/14/2022  Pt will be independent with advanced HEP to continue to address postural limitations and muscle imbalances. Baseline: not provided Goal status: INITIAL 2.  Pt will report <2/10 pain at baseline.  Baseline:  Goal status: INITIAL  3.  Pt will be able to vacuum without needing assistance.  Baseline:  Goal status: INITIAL  4.  Pt will be able to carry groceries in L hand without increased pain.  Baseline:  Goal status: INITIAL  5.  Pt will have  5/5 global strength for bilat UE's Baseline:  Goal status: INITIAL  PLAN: PT FREQUENCY: 2x/week  PT DURATION: 8 weeks  PLANNED INTERVENTIONS: Therapeutic exercises, Therapeutic activity, Neuromuscular re-education, Balance training, Gait training, Patient/Family education, Joint manipulation, Joint mobilization, Dry Needling, Electrical stimulation, Spinal manipulation, Spinal mobilization, Cryotherapy, Moist heat, Taping, Traction, Ultrasound, and Manual therapy  PLAN FOR NEXT SESSION: Assess HEP/update PRN, strengthen deep neck flexors, and shoulder girdle muscles. Decrease patients pain and help minimize functional mobility. Dry needling. Improve pec flexibility. Increase overall endurance.     Champ Mungo, PT 01/17/2022, 9:35 AM

## 2022-01-17 ENCOUNTER — Other Ambulatory Visit: Payer: Self-pay

## 2022-01-17 ENCOUNTER — Ambulatory Visit: Payer: BC Managed Care – PPO | Attending: Sports Medicine | Admitting: Physical Therapy

## 2022-01-17 ENCOUNTER — Encounter: Payer: Self-pay | Admitting: Neurology

## 2022-01-17 ENCOUNTER — Encounter: Payer: Self-pay | Admitting: Physical Therapy

## 2022-01-17 DIAGNOSIS — M9908 Segmental and somatic dysfunction of rib cage: Secondary | ICD-10-CM | POA: Diagnosis not present

## 2022-01-17 DIAGNOSIS — M542 Cervicalgia: Secondary | ICD-10-CM | POA: Insufficient documentation

## 2022-01-17 DIAGNOSIS — M9902 Segmental and somatic dysfunction of thoracic region: Secondary | ICD-10-CM | POA: Insufficient documentation

## 2022-01-17 DIAGNOSIS — G44229 Chronic tension-type headache, not intractable: Secondary | ICD-10-CM | POA: Insufficient documentation

## 2022-01-17 DIAGNOSIS — M25512 Pain in left shoulder: Secondary | ICD-10-CM | POA: Diagnosis present

## 2022-01-17 DIAGNOSIS — M9901 Segmental and somatic dysfunction of cervical region: Secondary | ICD-10-CM | POA: Diagnosis not present

## 2022-01-17 DIAGNOSIS — M546 Pain in thoracic spine: Secondary | ICD-10-CM | POA: Diagnosis present

## 2022-01-17 NOTE — Progress Notes (Signed)
Patient advised.

## 2022-01-24 NOTE — Therapy (Addendum)
OUTPATIENT PHYSICAL THERAPY TREATMENT NOTE   Patient Name: Amy Stanton MRN: [id number] DOB:05-Nov-1990, 31 y.o., female Today's Date: 01/26/2022  PCP: Mort Sawyers, FNP  REFERRING PROVIDER: Richardean Sale, DO  END OF SESSION:   PT End of Session - 01/26/22 1006     Visit Number 2    Number of Visits 16    Date for PT Re-Evaluation 03/14/22    Authorization Type BCBS    PT Start Time 0929    PT Stop Time 1010    PT Time Calculation (min) 41 min    Activity Tolerance Patient tolerated treatment well    Behavior During Therapy WFL for tasks assessed/performed             Past Medical History:  Diagnosis Date   Depression    Past Surgical History:  Procedure Laterality Date   BILATERAL SALPINGECTOMY Bilateral 02/08/2021   pt chose to do this for suspected endometrosis   Patient Active Problem List   Diagnosis Date Noted   Dysmenorrhea 10/19/2021   Migraine without aura, intractable, without status migrainosus 09/26/2021   Neck pain on left side 09/26/2021   Nonintractable headache 09/15/2021   Chronic tension-type headache, not intractable 09/15/2021   Screening for cervical cancer 09/15/2021    REFERRING DIAG: Neck pain on left side [M54.2], Somatic dysfunction of cervical region [M99.01], Somatic dysfunction of thoracic region [M99.02], Somatic dysfunction of lumbar region [M99.03], Somatic dysfunction of pelvic region [M99.05], Somatic dysfunction of rib region [M99.08], Chronic tension-type headache, not intractable [G44.229]  THERAPY DIAG:  Cervicalgia  Acute pain of left shoulder  Pain in thoracic spine  Rationale for Evaluation and Treatment Rehabilitation  PERTINENT HISTORY: Neck pain on left side [M54.2], Somatic dysfunction of cervical region [M99.01], Somatic dysfunction of thoracic region [M99.02], Somatic dysfunction of lumbar region [M99.03], Somatic dysfunction of pelvic region [M99.05], Somatic dysfunction of rib region [M99.08], Chronic  tension-type headache, not intractable [G44.229]  PRECAUTIONS: None  SUBJECTIVE: Pt states that she has been active with her HEP. She reports improved pain and increased mobility.   PAIN:  Are you having pain? Yes: NPRS scale: 3/10 Pain location: L shoulder into cervical/ thoracic spine.  Pain description: Dull ache that turns into sharp pain.  Aggravating factors: Driving, video gaming, carrying, lifting OH, sleeping.  Relieving factors: Exercises prescribed by MD, utilization of pillow, heat,  meds, standing while working.    OBJECTIVE:    DIAGNOSTIC FINDINGS:  12/13/2021:   3 mm focus of signal abnormality and punctate  enhancement within the left parietal lobe, as described. While this may reflect a cavernoma, alternative etiologies (such as an isolated intracranial metastasis) are possible. An initial short-interval 6 week follow-up brain MRI without and with contrast is recommended for surveillance of this finding. Otherwise unremarkable MRI appearance of the brain. Small mucous retention cyst within the left sphenoid sinus.   01/13/2022:  1. Unchanged 3 mm focus of signal abnormality in the l          eft parietal lobe most consistent with a benign cavernoma. 2. Otherwise unremarkable appearance of the brain.   PATIENT SURVEYS:  FOTO Initial 42, predicted 52     COGNITION: Overall cognitive status: Within functional limits for tasks assessed     SENSATION: WFL   POSTURE: rounded shoulders and forward head   PALPATION: L UT musculature,      CERVICAL ROM:    Active ROM A(deg) eval  Flexion WFL tight  Extension WFL  Right lateral flexion [facility]  Left lateral flexion WFL  Right rotation WFL  Left rotation WFL   (Blank rows = not tested)   UPPER EXTREMITY ROM:   Active ROM Right eval Left eval  Shoulder flexion [facility] WFL with crepitus reported  Shoulder extension [facility]  Shoulder abduction [facility]  Shoulder extension [facility]  Shoulder internal  rotation [facility]  Shoulder external rotation WFL WFL   (Blank rows = not tested)   UPPER EXTREMITY MMT:   MMT Right eval Left eval  Shoulder flexion 4+ 4+  Shoulder extension      Shoulder abduction 5 5  Shoulder internal rotation 5 5  Shoulder external rotation 4+ 4  Middle trapezius 4- 4-  Lower trapezius 4- 4-  Elbow flexion 5 5  Elbow extension 5 5   (Blank rows = not tested)   CERVICAL SPECIAL TESTS:  Upper limb tension test (ULTT): Positive L side only   TODAY'S TREATMENT:   [facility] Adult PT Treatment:                                                DATE: 01/26/2022 Therapeutic Exercise: UBE 2.5 fwd/ 2.5 bkwd Seated Scapular Retraction  2 sets, 10 reps Bilat Shoulder Ext RTB  2 sets, 10 reps Supine Chin Tuck  2 sets, 10 reps Prone Press Up On Elbows  2 sets, 10 reps Thread the needle 10 x bilat with foam roller for cues.  Rows with RTB 2 sets 10 reps  Shoulder extension 2 sets 10 reps Doorway Pec Stretch at 60 Elevation  2 sets, 4 reps Ball taps on wall [place] 1 min Manual Therapy: High velocity, low amplitude manip to thoracic region.   See HEP below.    PATIENT EDUCATION:  Education details: Educated pt on anatomy and physiology of current symptoms, FOTO, diagnosis, prognosis, HEP,  and POC.   Person educated: Patient Education method: Explanation, Demonstration, Tactile cues, Verbal cues, and Handouts Education comprehension: verbalized understanding     HOME EXERCISE PROGRAM: Access Code: [facility] URL: [email or web address] Date: 01/17/2022 Prepared by: Royal Hawthorn   Exercises - Seated Scapular Retraction  - 2 x daily - 7 x weekly - 2 sets - 10 reps - Shoulder External Rotation and Scapular Retraction with Resistance  - 2 x daily - 7 x weekly - 2 sets - 10 reps - Supine Chin Tuck  - 2 x daily - 7 x weekly - 2 sets - 10 reps - Prone Press Up On Elbows  - 1 x daily - 7 x weekly - 3 sets - 10 reps - Doorway Pec Stretch at 60 Elevation  - 2 x  daily - 7 x weekly - 2 sets - 4 reps   ASSESSMENT:   CLINICAL IMPRESSION: Patient reports to first PT follow up with improved pain and mobility. Session initaited on UBE with seat placed further back for increased thoracic mobility. Followed up with review of HEP with good forum noted and cues only needed for chin tucks due to pt lifting head. Progress pt with increased parascapular strengthening exercises. Pt reports no increase in pain, but reports increased muscle fatigue. Pt will continue to benefit from skilled PT to address continued deficits.   OBJECTIVE IMPAIRMENTS decreased endurance, decreased strength, increased muscle spasms, impaired UE functional use, improper body mechanics, postural dysfunction, and pain.    ACTIVITY LIMITATIONS carrying,  lifting, sleeping, and reach over head   PARTICIPATION LIMITATIONS: meal prep, cleaning, laundry, interpersonal relationship, driving, and shopping   PERSONAL FACTORS Fitness, Past/current experiences, Profession, and 1 comorbidity: Depression  are also affecting patient's functional outcome.    REHAB POTENTIAL: Good   CLINICAL DECISION MAKING: Stable/uncomplicated   EVALUATION COMPLEXITY: Low     GOALS: Goals reviewed with patient? No   SHORT TERM GOALS: Target date: 02/14/2022    Pt will be I and compliant with initial HEP. Baseline: provided at eval Goal status: INITIAL   2.  Pt will report a decrease in baseline pain to 4/10.  Baseline:  Goal status: INITIAL 4.  Pt will report 50% less headaches throughout the day.  Baseline:  Goal status: INITIAL     LONG TERM GOALS: Target date: 03/14/2022   Pt will be independent with advanced HEP to continue to address postural limitations and muscle imbalances. Baseline: not provided Goal status: INITIAL 2.  Pt will report <2/10 pain at baseline.  Baseline:  Goal status: INITIAL   3.  Pt will be able to vacuum without needing assistance.  Baseline:  Goal status: INITIAL   4.   Pt will be able to carry groceries in L hand without increased pain.  Baseline:  Goal status: INITIAL   5.  Pt will have 5/5 global strength for bilat UE's Baseline:  Goal status: INITIAL   PLAN: PT FREQUENCY: 2x/week   PT DURATION: 8 weeks   PLANNED INTERVENTIONS: Therapeutic exercises, Therapeutic activity, Neuromuscular re-education, Balance training, Gait training, Patient/Family education, Joint manipulation, Joint mobilization, Dry Needling, Electrical stimulation, Spinal manipulation, Spinal mobilization, Cryotherapy, Moist heat, Taping, Traction, Ultrasound, and Manual therapy   PLAN FOR NEXT SESSION: Assess HEP/update PRN, strengthen deep neck flexors, and shoulder girdle muscles. Decrease patients pain and help minimize functional mobility. Dry needling. Improve pec flexibility. Increase overall endurance.    Champ Mungo, PT 01/26/2022, 1:02 PM

## 2022-01-25 NOTE — Progress Notes (Deleted)
   Amy Stanton D.[facility] [address] [place] [address] Phone: [phone]   Assessment and Plan:     There are no diagnoses linked to this encounter.  *** - Patient has received significant relief with OMT in the past.  Elects for repeat OMT today.  Tolerated well per note below. - Decision today to treat with OMT was based on Physical Exam   After verbal consent patient was treated with HVLA (high velocity low amplitude), ME (muscle energy), FPR (flex positional release), ST (soft tissue), PC/PD (Pelvic Compression/ Pelvic Decompression) techniques in cervical, rib, thoracic, lumbar, and pelvic areas. Patient tolerated the procedure well with improvement in symptoms.  Patient educated on potential side effects of soreness and recommended to rest, hydrate, and use Tylenol as needed for pain control.   Pertinent previous records reviewed include ***   Follow Up: ***     Subjective:   I, Amy Stanton, am serving as a Neurosurgeon for Doctor Richardean Sale  Chief Complaint: back and shoulder pain    HPI:  12/16/2021 Patient is a 31 year old female complaining of back and shoulder pain. Patient states that she has headaches hasnt really responded to much , thinks it might be tension headaches , uses a posture brace that helps with headaches  is an avid gamer, interested in OMT , shoulder radiates to head and neck left sided pain , yoga has really helped with shoulder pain and headaches    01/02/2022 Patient states that she went on a road trip and she was slacking on HEP is kind of achy , is feeling a lot of neck tension and shoulder tension, headaches have started to die down hasn/'t had to use as much medication the HEP for the neck has really helped   02/01/2022 Patient states    Relevant Historical Information: Chronic headaches  Additional pertinent review of systems negative.  Current Outpatient Medications  Medication Sig Dispense Refill    cyclobenzaprine (FLEXERIL) 5 MG tablet Take 1 tablet (5 mg total) by mouth at bedtime as needed for muscle spasms. 15 tablet 0   ibuprofen (ADVIL) 800 MG tablet Take 1 tablet (800 mg total) by mouth every 8 (eight) hours as needed. 30 tablet 1   loratadine (CLARITIN) 10 MG tablet Take 10 mg by mouth daily.     ondansetron (ZOFRAN) 8 MG tablet Take 1 tablet (8 mg total) by mouth every 8 (eight) hours as needed for nausea or vomiting. (Patient not taking: Reported on 01/17/2022) 20 tablet 5   rizatriptan (MAXALT) 10 MG tablet Take 1 tablet earliest onset of headache.  May repeat in 2 hours if needed.  Maximum 2 tablets in 24 hours. 10 tablet 5   topiramate (TOPAMAX) 100 MG tablet TAKE 1 TABLET(100 MG) BY MOUTH AT BEDTIME 30 tablet 0   No current facility-administered medications for this visit.      Objective:     There were no vitals filed for this visit.    There is no height or weight on file to calculate BMI.    Physical Exam:     General: Well-appearing, cooperative, sitting comfortably in no acute distress.   OMT Physical Exam:  ASIS Compression Test: Positive Right Cervical: TTP paraspinal, *** Rib: Bilateral elevated first rib with TTP Thoracic: TTP paraspinal,*** Lumbar: TTP paraspinal,*** Pelvis: Right anterior innominate  Electronically signed by:  Amy Stanton D.[facility] 1:16 PM 01/25/22

## 2022-01-26 ENCOUNTER — Other Ambulatory Visit: Payer: Self-pay

## 2022-01-26 ENCOUNTER — Ambulatory Visit: Payer: BC Managed Care – PPO | Admitting: Physical Therapy

## 2022-01-26 ENCOUNTER — Encounter: Payer: Self-pay | Admitting: Physical Therapy

## 2022-01-26 DIAGNOSIS — M542 Cervicalgia: Secondary | ICD-10-CM

## 2022-01-26 DIAGNOSIS — M546 Pain in thoracic spine: Secondary | ICD-10-CM

## 2022-01-26 DIAGNOSIS — M25512 Pain in left shoulder: Secondary | ICD-10-CM

## 2022-01-26 NOTE — Therapy (Signed)
OUTPATIENT PHYSICAL THERAPY TREATMENT NOTE   Patient Name: Amy Stanton MRN: [id number] DOB:[birth year], 31 y.o., female Today's Date: 01/30/2022  PCP: Eugenia Pancoast, FNP  REFERRING PROVIDER: Glennon Mac, DO  END OF SESSION:   PT End of Session - 01/30/22 0836     Visit Number 3    Number of Visits 16    Date for PT Re-Evaluation 03/14/22    Authorization Type BCBS    PT Start Time 0845    Activity Tolerance Patient tolerated treatment well    Behavior During Therapy [facility] for tasks assessed/performed             Past Medical History:  Diagnosis Date   Depression    Past Surgical History:  Procedure Laterality Date   BILATERAL SALPINGECTOMY Bilateral 02/08/2021   pt chose to do this for suspected endometrosis   Patient Active Problem List   Diagnosis Date Noted   Dysmenorrhea 10/19/2021   Migraine without aura, intractable, without status migrainosus 09/26/2021   Neck pain on left side 09/26/2021   Nonintractable headache 09/15/2021   Chronic tension-type headache, not intractable 09/15/2021   Screening for cervical cancer 09/15/2021    REFERRING DIAG: Neck pain on left side [M54.2], Somatic dysfunction of cervical region [M99.01], Somatic dysfunction of thoracic region [M99.02], Somatic dysfunction of lumbar region [M99.03], Somatic dysfunction of pelvic region [M99.05], Somatic dysfunction of rib region [M99.08], Chronic tension-type headache, not intractable [G44.229]  THERAPY DIAG:  Cervicalgia  Acute pain of left shoulder  Pain in thoracic spine  Rationale for Evaluation and Treatment Rehabilitation  PERTINENT HISTORY: Neck pain on left side [M54.2], Somatic dysfunction of cervical region [M99.01], Somatic dysfunction of thoracic region [M99.02], Somatic dysfunction of lumbar region [M99.03], Somatic dysfunction of pelvic region [M99.05], Somatic dysfunction of rib region [M99.08], Chronic tension-type headache, not intractable  [G44.229]  PRECAUTIONS: None  SUBJECTIVE: Pt states that her pain is starting to centralize to her mid thoracic spine, and is less notable in her L shoulder and neck with functional activities.    PAIN:  Are you having pain? Yes: NPRS scale: 4/10 Pain location: L shoulder into cervical/ thoracic spine.  Pain description: Dull ache that turns into sharp pain.  Aggravating factors: Driving, video gaming, carrying, lifting OH, sleeping.  Relieving factors: Exercises prescribed by MD, utilization of pillow, heat,  meds, standing while working.    OBJECTIVE:    DIAGNOSTIC FINDINGS:  12/13/2021:   3 mm focus of signal abnormality and punctate  enhancement within the left parietal lobe, as described. While this may reflect a cavernoma, alternative etiologies (such as an isolated intracranial metastasis) are possible. An initial short-interval 6 week follow-up brain MRI without and with contrast is recommended for surveillance of this finding. Otherwise unremarkable MRI appearance of the brain. Small mucous retention cyst within the left sphenoid sinus.   01/13/2022:  1. 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 2. Otherwise unremarkable appearance of the brain.   PATIENT SURVEYS:  FOTO Initial 42, predicted 57     COGNITION: Overall cognitive status: Within functional limits for tasks assessed   SENSATION: WFL   POSTURE: rounded shoulders and forward head   PALPATION: L UT musculature,      CERVICAL ROM:    Active ROM A(deg) eval  Flexion WFL tight  Extension WFL  Right lateral flexion WFL  Left lateral flexion WFL  Right rotation WFL  Left rotation WFL   (Blank rows =  not tested)   UPPER EXTREMITY ROM:   Active ROM Right eval Left eval  Shoulder flexion [facility] WFL with crepitus reported  Shoulder extension [facility]  Shoulder abduction [facility]  Shoulder extension [facility]  Shoulder internal  rotation [facility]  Shoulder external rotation WFL WFL   (Blank rows = not tested)   UPPER EXTREMITY MMT:   MMT Right eval Left eval  Shoulder flexion 4+ 4+  Shoulder extension      Shoulder abduction 5 5  Shoulder internal rotation 5 5  Shoulder external rotation 4+ 4  Middle trapezius 4- 4-  Lower trapezius 4- 4-  Elbow flexion 5 5  Elbow extension 5 5   (Blank rows = not tested)   CERVICAL SPECIAL TESTS:  Upper limb tension test (ULTT): Positive L side only   TODAY'S TREATMENT:  [facility] Adult PT Treatment:                                                DATE: 01/30/2022 Therapeutic Exercise: UBE 2 fwd/ 2 bkwd Thread the needle 10 x bilat with foam roller for cues.  Supine Chin Tuck  2 sets, 10 reps Horizontal Abduction with RTB 2 sets, 10 reps Bilat Shoulder Ext RTB  2 sets, 10 reps Prone Y's 2 sets, 10 reps Prone T's 2 sets, 10 reps Prone Press Up On Elbows  2 sets, 10 reps Supine pec stretch on half foam roller 1x 1 min hold.  Thoracic extension over foam roller 10 reps, 5 sec hold.  Open books on wall 5x each side.  Rows with RTB 2 sets 10 reps  Shoulder extension 2 sets 10 reps Doorway Pec Stretch at 60 Elevation  2 sets, 4 reps Ball taps on wall [place] 1 min Manual Therapy: High velocity, low amplitude manip to thoracic region.   [place] Adult PT Treatment:                                                DATE: 01/26/2022 Therapeutic Exercise: UBE 2.5 fwd/ 2.5 bkwd Seated Scapular Retraction  2 sets, 10 reps Bilat Shoulder Ext RTB  2 sets, 10 reps Supine Chin Tuck  2 sets, 10 reps Prone Press Up On Elbows  2 sets, 10 reps Thread the needle 10 x bilat with foam roller for cues.  Rows with GTB 2 sets 10 reps  Shoulder extension GTB 2 sets 10 reps Doorway Pec Stretch at 60 Elevation  2 sets, 4 reps Manual Therapy: High velocity, low amplitude manip to thoracic region.   See HEP below.    PATIENT EDUCATION:  Education details: Educated pt on anatomy and physiology of  current symptoms, FOTO, diagnosis, prognosis, HEP,  and POC.   Person educated: Patient Education method: Explanation, Demonstration, Tactile cues, Verbal cues, and Handouts Education comprehension: verbalized understanding     HOME EXERCISE PROGRAM: Access Code: [facility] URL: [email or web address] Date: 01/17/2022 Prepared by: Rudi Heap   Exercises - Seated Scapular Retraction  - 2 x daily - 7 x weekly - 2 sets - 10 reps - Shoulder External Rotation and Scapular Retraction with Resistance  - 2 x daily - 7 x weekly - 2 sets - 10 reps - Supine Chin Tuck  -  2 x daily - 7 x weekly - 2 sets - 10 reps - Prone Press Up On Elbows  - 1 x daily - 7 x weekly - 3 sets - 10 reps - Doorway Pec Stretch at 60 Elevation  - 2 x daily - 7 x weekly - 2 sets - 4 reps   01/30/2022 - Prone Shoulder Horizontal Abduction with Thumbs Up  - 2 x daily - 7 x weekly - 2 sets - 10 reps - Prone Single Arm Middle Trapezius Strengthening on Swiss Ball  - 2 x daily - 7 x weekly - 2 sets - 10 reps - Standing Thoracic Open Book at [place] 1 x daily - 7 x weekly - 3 sets - 10 reps  ASSESSMENT:   CLINICAL IMPRESSION: Patient reports improved symptoms with functional activities such as lifting groceries and cleaning. She states that it has been over a week since her last headache. She also reports less fatigue when playing video games demonstrating improvements with PT thus far. Further challenged pt today with prone T's and Y's with good forum, with cues only needed for initial set up. Pt tolerated session well with increased muscle fatigue reported, but no increase in pain. Added open books, T's and Y's to HEP.  Pt will continue to benefit from skilled PT to address continued deficits.   OBJECTIVE IMPAIRMENTS decreased endurance, decreased strength, increased muscle spasms, impaired UE functional use, improper body mechanics, postural dysfunction, and pain.    ACTIVITY LIMITATIONS carrying, lifting,  sleeping, and reach over head   PARTICIPATION LIMITATIONS: meal prep, cleaning, laundry, interpersonal relationship, driving, and shopping   PERSONAL FACTORS Fitness, Past/current experiences, Profession, and 1 comorbidity: Depression  are also affecting patient's functional outcome.    REHAB POTENTIAL: Good   CLINICAL DECISION MAKING: Stable/uncomplicated   EVALUATION COMPLEXITY: Low     GOALS: Goals reviewed with patient? No   SHORT TERM GOALS: Target date: 02/14/2022    Pt will be I and compliant with initial HEP. Baseline: provided at eval Goal status: Ongoing   2.  Pt will report a decrease in baseline pain to 4/10.  Baseline:  Goal status: MET 01/30/2022 4.  Pt will report 50% less headaches throughout the day.  Baseline:  Goal status: MET 01/30/2022     LONG TERM GOALS: Target date: 03/14/2022   Pt will be independent with advanced HEP to continue to address postural limitations and muscle imbalances. Baseline: not provided Goal status: INITIAL 2.  Pt will report <2/10 pain at baseline.  Baseline:  Goal status: INITIAL   3.  Pt will be able to vacuum without needing assistance.  Baseline:  Goal status: INITIAL   4.  Pt will be able to carry groceries in L hand without increased pain.  Baseline:  Goal status: INITIAL   5.  Pt will have 5/5 global strength for bilat UE's Baseline:  Goal status: INITIAL   PLAN: PT FREQUENCY: 2x/week   PT DURATION: 8 weeks   PLANNED INTERVENTIONS: Therapeutic exercises, Therapeutic activity, Neuromuscular re-education, Balance training, Gait training, Patient/Family education, Joint manipulation, Joint mobilization, Dry Needling, Electrical stimulation, Spinal manipulation, Spinal mobilization, Cryotherapy, Moist heat, Taping, Traction, Ultrasound, and Manual therapy   PLAN FOR NEXT SESSION: Assess HEP/update PRN, strengthen deep neck flexors, and shoulder girdle muscles. Decrease patients pain and help minimize functional  mobility. Dry needling. Improve pec flexibility. Increase overall endurance.    Lynden Ang, PT 01/30/2022, 8:37 AM

## 2022-01-30 ENCOUNTER — Ambulatory Visit: Payer: BC Managed Care – PPO | Attending: Sports Medicine | Admitting: Physical Therapy

## 2022-01-30 ENCOUNTER — Encounter: Payer: Self-pay | Admitting: Physical Therapy

## 2022-01-30 DIAGNOSIS — M25512 Pain in left shoulder: Secondary | ICD-10-CM | POA: Diagnosis present

## 2022-01-30 DIAGNOSIS — M546 Pain in thoracic spine: Secondary | ICD-10-CM | POA: Diagnosis present

## 2022-01-30 DIAGNOSIS — M542 Cervicalgia: Secondary | ICD-10-CM | POA: Diagnosis not present

## 2022-01-30 NOTE — Therapy (Deleted)
OUTPATIENT PHYSICAL THERAPY TREATMENT NOTE   Patient Name: Amy Stanton MRN: [id number] DOB:03-02-1991, 31 y.o., female Today's Date: 01/30/2022  PCP: Eugenia Pancoast, FNP  REFERRING PROVIDER: Glennon Mac, DO  END OF SESSION:     Past Medical History:  Diagnosis Date   Depression    Past Surgical History:  Procedure Laterality Date   BILATERAL SALPINGECTOMY Bilateral 02/08/2021   pt chose to do this for suspected endometrosis   Patient Active Problem List   Diagnosis Date Noted   Dysmenorrhea 10/19/2021   Migraine without aura, intractable, without status migrainosus 09/26/2021   Neck pain on left side 09/26/2021   Nonintractable headache 09/15/2021   Chronic tension-type headache, not intractable 09/15/2021   Screening for cervical cancer 09/15/2021    REFERRING DIAG: Neck pain on left side [M54.2], Somatic dysfunction of cervical region [M99.01], Somatic dysfunction of thoracic region [M99.02], Somatic dysfunction of lumbar region [M99.03], Somatic dysfunction of pelvic region [M99.05], Somatic dysfunction of rib region [M99.08], Chronic tension-type headache, not intractable [G44.229]  THERAPY DIAG:  No diagnosis found.  Rationale for Evaluation and Treatment Rehabilitation  PERTINENT HISTORY: Neck pain on left side [M54.2], Somatic dysfunction of cervical region [M99.01], Somatic dysfunction of thoracic region [M99.02], Somatic dysfunction of lumbar region [M99.03], Somatic dysfunction of pelvic region [M99.05], Somatic dysfunction of rib region [M99.08], Chronic tension-type headache, not intractable [G44.229]  PRECAUTIONS: None  SUBJECTIVE: Pt states that her pain is starting to centralize to her mid thoracic spine, and is less notable in her L shoulder and neck with functional activities.    PAIN:  Are you having pain? Yes: NPRS scale: 4/10 Pain location: L shoulder into cervical/ thoracic spine.  Pain description: Dull ache that turns into sharp pain.   Aggravating factors: Driving, video gaming, carrying, lifting OH, sleeping.  Relieving factors: Exercises prescribed by MD, utilization of pillow, heat,  meds, standing while working.    OBJECTIVE:    DIAGNOSTIC FINDINGS:  12/13/2021:   3 mm focus of signal abnormality and punctate  enhancement within the left parietal lobe, as described. While this may reflect a cavernoma, alternative etiologies (such as an isolated intracranial metastasis) are possible. An initial short-interval 6 week follow-up brain MRI without and with contrast is recommended for surveillance of this finding. Otherwise unremarkable MRI appearance of the brain. Small mucous retention cyst within the left sphenoid sinus.   01/13/2022:  1. 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 2. Otherwise unremarkable appearance of the brain.   PATIENT SURVEYS:  FOTO Initial 42, predicted 21     COGNITION: Overall cognitive status: Within functional limits for tasks assessed   SENSATION: WFL   POSTURE: rounded shoulders and forward head   PALPATION: L UT musculature,      CERVICAL ROM:    Active ROM A(deg) eval  Flexion WFL tight  Extension WFL  Right lateral flexion WFL  Left lateral flexion WFL  Right rotation WFL  Left rotation WFL   (Blank rows = not tested)   UPPER EXTREMITY ROM:   Active ROM Right eval Left eval  Shoulder flexion [facility] WFL with crepitus reported  Shoulder extension [facility]  Shoulder abduction [facility]  Shoulder extension [facility]  Shoulder internal rotation [facility]  Shoulder external rotation WFL WFL   (Blank rows = not tested)   UPPER EXTREMITY MMT:   MMT Right eval Left eval  Shoulder flexion 4+ 4+  Shoulder extension  Shoulder abduction 5 5  Shoulder internal rotation 5 5  Shoulder external rotation 4+ 4  Middle trapezius 4- 4-  Lower trapezius 4- 4-  Elbow flexion 5 5  Elbow extension 5 5    (Blank rows = not tested)   CERVICAL SPECIAL TESTS:  Upper limb tension test (ULTT): Positive L side only   TODAY'S TREATMENT:  [facility] Adult PT Treatment:                                                DATE: 01/30/2022 Therapeutic Exercise: UBE 2 fwd/ 2 bkwd Thread the needle 10 x bilat with foam roller for cues.  Supine Chin Tuck  2 sets, 10 reps Horizontal Abduction with RTB 2 sets, 10 reps Bilat Shoulder Ext RTB  2 sets, 10 reps Prone Y's 2 sets, 10 reps Prone T's 2 sets, 10 reps Prone Press Up On Elbows  2 sets, 10 reps Supine pec stretch on half foam roller 1x 1 min hold.  Thoracic extension over foam roller 10 reps, 5 sec hold.  Open books on wall 5x each side.  Rows with RTB 2 sets 10 reps  Shoulder extension 2 sets 10 reps Doorway Pec Stretch at 60 Elevation  2 sets, 4 reps Ball taps on wall [place] 1 min Manual Therapy: High velocity, low amplitude manip to thoracic region.   [place] Adult PT Treatment:                                                DATE: 01/26/2022 Therapeutic Exercise: UBE 2.5 fwd/ 2.5 bkwd Seated Scapular Retraction  2 sets, 10 reps Bilat Shoulder Ext RTB  2 sets, 10 reps Supine Chin Tuck  2 sets, 10 reps Prone Press Up On Elbows  2 sets, 10 reps Thread the needle 10 x bilat with foam roller for cues.  Rows with GTB 2 sets 10 reps  Shoulder extension GTB 2 sets 10 reps Doorway Pec Stretch at 60 Elevation  2 sets, 4 reps Manual Therapy: High velocity, low amplitude manip to thoracic region.   See HEP below.    PATIENT EDUCATION:  Education details: Educated pt on anatomy and physiology of current symptoms, FOTO, diagnosis, prognosis, HEP,  and POC.   Person educated: Patient Education method: Explanation, Demonstration, Tactile cues, Verbal cues, and Handouts Education comprehension: verbalized understanding     HOME EXERCISE PROGRAM: Access Code: [facility] URL: [email or web address] Date: 01/17/2022 Prepared by: Rudi Heap    Exercises - Seated Scapular Retraction  - 2 x daily - 7 x weekly - 2 sets - 10 reps - Shoulder External Rotation and Scapular Retraction with Resistance  - 2 x daily - 7 x weekly - 2 sets - 10 reps - Supine Chin Tuck  - 2 x daily - 7 x weekly - 2 sets - 10 reps - Prone Press Up On Elbows  - 1 x daily - 7 x weekly - 3 sets - 10 reps - Doorway Pec Stretch at 60 Elevation  - 2 x daily - 7 x weekly - 2 sets - 4 reps   01/30/2022 - Prone Shoulder Horizontal Abduction with Thumbs Up  - 2 x daily - 7 x weekly - 2 sets -  10 reps - Prone Single Arm Middle Trapezius Strengthening on Swiss Ball  - 2 x daily - 7 x weekly - 2 sets - 10 reps - Standing Thoracic Open Book at [place]  - 1 x daily - 7 x weekly - 3 sets - 10 reps  ASSESSMENT:   CLINICAL IMPRESSION: Patient reports improved symptoms with functional activities such as lifting groceries and cleaning. She states that it has been over a week since her last headache. She also reports less fatigue when playing video games demonstrating improvements with PT thus far. Further challenged pt today with prone T's and Y's with good forum, with cues only needed for initial set up. Pt tolerated session well with increased muscle fatigue reported, but no increase in pain. Added open books, T's and Y's to HEP.  Pt will continue to benefit from skilled PT to address continued deficits.   OBJECTIVE IMPAIRMENTS decreased endurance, decreased strength, increased muscle spasms, impaired UE functional use, improper body mechanics, postural dysfunction, and pain.    ACTIVITY LIMITATIONS carrying, lifting, sleeping, and reach over head   PARTICIPATION LIMITATIONS: meal prep, cleaning, laundry, interpersonal relationship, driving, and shopping   PERSONAL FACTORS Fitness, Past/current experiences, Profession, and 1 comorbidity: Depression  are also affecting patient's functional outcome.    REHAB POTENTIAL: Good   CLINICAL DECISION MAKING: Stable/uncomplicated    EVALUATION COMPLEXITY: Low     GOALS: Goals reviewed with patient? No   SHORT TERM GOALS: Target date: 02/14/2022    Pt will be I and compliant with initial HEP. Baseline: provided at eval Goal status: Ongoing   2.  Pt will report a decrease in baseline pain to 4/10.  Baseline:  Goal status: MET 01/30/2022 4.  Pt will report 50% less headaches throughout the day.  Baseline:  Goal status: MET 01/30/2022     LONG TERM GOALS: Target date: 03/14/2022   Pt will be independent with advanced HEP to continue to address postural limitations and muscle imbalances. Baseline: not provided Goal status: INITIAL 2.  Pt will report <2/10 pain at baseline.  Baseline:  Goal status: INITIAL   3.  Pt will be able to vacuum without needing assistance.  Baseline:  Goal status: INITIAL   4.  Pt will be able to carry groceries in L hand without increased pain.  Baseline:  Goal status: INITIAL   5.  Pt will have 5/5 global strength for bilat UE's Baseline:  Goal status: INITIAL   PLAN: PT FREQUENCY: 2x/week   PT DURATION: 8 weeks   PLANNED INTERVENTIONS: Therapeutic exercises, Therapeutic activity, Neuromuscular re-education, Balance training, Gait training, Patient/Family education, Joint manipulation, Joint mobilization, Dry Needling, Electrical stimulation, Spinal manipulation, Spinal mobilization, Cryotherapy, Moist heat, Taping, Traction, Ultrasound, and Manual therapy   PLAN FOR NEXT SESSION: Assess HEP/update PRN, strengthen deep neck flexors, and shoulder girdle muscles. Decrease patients pain and help minimize functional mobility. Dry needling. Improve pec flexibility. Increase overall endurance.    Lynden Ang, PT 01/30/2022, 1:17 PM

## 2022-01-31 ENCOUNTER — Other Ambulatory Visit: Payer: Self-pay | Admitting: Neurology

## 2022-02-01 ENCOUNTER — Ambulatory Visit: Payer: BC Managed Care – PPO | Admitting: Sports Medicine

## 2022-02-01 ENCOUNTER — Other Ambulatory Visit: Payer: Self-pay

## 2022-02-01 MED ORDER — UBRELVY 100 MG PO TABS: 100.0000 mg | ORAL_TABLET | ORAL | 5 refills | Status: DC | PRN

## 2022-02-01 NOTE — Progress Notes (Signed)
Per DR.jaffe,  prescription for Ubrelvy 100mg  - take as needed.  May repeat after 2 hours.  Maximum 2 tablets in 24 hours.

## 2022-02-02 ENCOUNTER — Other Ambulatory Visit (HOSPITAL_COMMUNITY): Payer: Self-pay

## 2022-02-02 ENCOUNTER — Ambulatory Visit: Payer: BC Managed Care – PPO | Admitting: Physical Therapy

## 2022-02-02 ENCOUNTER — Telehealth (HOSPITAL_COMMUNITY): Payer: Self-pay | Admitting: Pharmacy Technician

## 2022-02-02 NOTE — Telephone Encounter (Signed)
Patient Advocate Encounter   Received notification that prior authorization for Ubrelvy 100MG  tablets is required.   PA submitted on 02/02/2022 Key [id number] Status is pending       04/05/2022, CPhT Pharmacy Patient Advocate Specialist [facility] Patient Advocate Team Direct Number: [phone]  Fax: [phone]

## 2022-02-02 NOTE — Telephone Encounter (Signed)
Patient Advocate Encounter  Prior Authorization for Bernita Raisin 100MG  tablets  has been approved.    PA# Effective dates: 02/02/2022 through 02/02/2023     04/05/2023, CPhT Pharmacy Patient Advocate Specialist [facility] Patient Advocate Team Direct Number: [phone]  Fax: [phone]

## 2022-02-02 NOTE — Therapy (Addendum)
OUTPATIENT PHYSICAL THERAPY TREATMENT NOTE   Patient Name: Amy Stanton MRN: [id number] DOB:May 20, 1991, 31 y.o., female Today's Date: 02/02/2022  PCP: Eugenia Pancoast, FNP  REFERRING PROVIDER: Glennon Mac, DO  END OF SESSION:     Past Medical History:  Diagnosis Date   Depression    Past Surgical History:  Procedure Laterality Date   BILATERAL SALPINGECTOMY Bilateral 02/08/2021   pt chose to do this for suspected endometrosis   Patient Active Problem List   Diagnosis Date Noted   Dysmenorrhea 10/19/2021   Migraine without aura, intractable, without status migrainosus 09/26/2021   Neck pain on left side 09/26/2021   Nonintractable headache 09/15/2021   Chronic tension-type headache, not intractable 09/15/2021   Screening for cervical cancer 09/15/2021    REFERRING DIAG: Neck pain on left side [M54.2], Somatic dysfunction of cervical region [M99.01], Somatic dysfunction of thoracic region [M99.02], Somatic dysfunction of lumbar region [M99.03], Somatic dysfunction of pelvic region [M99.05], Somatic dysfunction of rib region [M99.08], Chronic tension-type headache, not intractable [G44.229]  THERAPY DIAG:  No diagnosis found.  Rationale for Evaluation and Treatment Rehabilitation  PERTINENT HISTORY: Neck pain on left side [M54.2], Somatic dysfunction of cervical region [M99.01], Somatic dysfunction of thoracic region [M99.02], Somatic dysfunction of lumbar region [M99.03], Somatic dysfunction of pelvic region [M99.05], Somatic dysfunction of rib region [M99.08], Chronic tension-type headache, not intractable [G44.229]  PRECAUTIONS: None  SUBJECTIVE: Pt states that she has not been having much shoulder pain, however she has had a headache for the past 3 days.   PAIN:  Are you having pain? Yes: NPRS scale: 4/10 Pain location: L shoulder into cervical/ thoracic spine.  Pain description: Dull ache that turns into sharp pain.  Aggravating factors: Driving, video  gaming, carrying, lifting OH, sleeping.  Relieving factors: Exercises prescribed by MD, utilization of pillow, heat,  meds, standing while working.    OBJECTIVE:    DIAGNOSTIC FINDINGS:  12/13/2021:   3 mm focus of signal abnormality and punctate  enhancement within the left parietal lobe, as described. While this may reflect a cavernoma, alternative etiologies (such as an isolated intracranial metastasis) are possible. An initial short-interval 6 week follow-up brain MRI without and with contrast is recommended for surveillance of this finding. Otherwise unremarkable MRI appearance of the brain. Small mucous retention cyst within the left sphenoid sinus.   01/13/2022:  1. 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 2. Otherwise unremarkable appearance of the brain.   PATIENT SURVEYS:  FOTO Initial 42, predicted 25     COGNITION: Overall cognitive status: Within functional limits for tasks assessed   SENSATION: WFL   POSTURE: rounded shoulders and forward head   PALPATION: L UT musculature,      CERVICAL ROM:    Active ROM A(deg) eval  Flexion WFL tight  Extension WFL  Right lateral flexion WFL  Left lateral flexion WFL  Right rotation WFL  Left rotation WFL   (Blank rows = not tested)   UPPER EXTREMITY ROM:   Active ROM Right eval Left eval  Shoulder flexion [facility] WFL with crepitus reported  Shoulder extension [facility]  Shoulder abduction [facility]  Shoulder extension [facility]  Shoulder internal rotation [facility]  Shoulder external rotation WFL WFL   (Blank rows = not tested)   UPPER EXTREMITY MMT:   MMT Right eval Left eval  Shoulder flexion 4+ 4+  Shoulder extension      Shoulder abduction 5 5  Shoulder internal rotation 5 5  Shoulder external rotation 4+ 4  Middle trapezius 4- 4-  Lower trapezius 4- 4-  Elbow flexion 5 5  Elbow extension 5 5   (Blank rows = not tested)    CERVICAL SPECIAL TESTS:  Upper limb tension test (ULTT): Positive L side only   TODAY'S TREATMENT:  [facility] Adult PT Treatment:                                                DATE: 02/07/2022 Therapeutic Exercise: UBE 2 fwd/ 2 bkwd Thread the needle 10 x bilat with foam roller for cues.  Cat/ cow 10 x Supine Chin Tuck  2 sets, 10 reps with ball against wall.  Horizontal Abduction with GTB 2 sets, 10 reps against wall Shoulder extension 2 sets 10 reps GTB Rows with GTB 2 sets 10 reps Supine pec stretch on half foam roller 1x 1 min hold.  Prone Press Up On Elbows  2 sets, 10 reps Thoracic extension over foam roller 10 reps, 5 sec hold.  Open books on wall 5x each side.   [place] Adult PT Treatment:                                                DATE: 01/30/2022 Therapeutic Exercise: UBE 2 fwd/ 2 bkwd Thread the needle 10 x bilat with foam roller for cues.  Supine Chin Tuck  2 sets, 10 reps Horizontal Abduction with RTB 2 sets, 10 reps Bilat Shoulder Ext RTB  2 sets, 10 reps Prone Y's 2 sets, 10 reps Prone T's 2 sets, 10 reps Prone Press Up On Elbows  2 sets, 10 reps Supine pec stretch on half foam roller 1x 1 min hold.  Thoracic extension over foam roller 10 reps, 5 sec hold.  Open books on wall 5x each side.  Rows with RTB 2 sets 10 reps  Shoulder extension 2 sets 10 reps Doorway Pec Stretch at 60 Elevation  2 sets, 4 reps Ball taps on wall [place] 1 min Manual Therapy: High velocity, low amplitude manip to thoracic region.   [place] Adult PT Treatment:                                                DATE: 01/26/2022 Therapeutic Exercise: UBE 2.5 fwd/ 2.5 bkwd Seated Scapular Retraction  2 sets, 10 reps Bilat Shoulder Ext RTB  2 sets, 10 reps Supine Chin Tuck  2 sets, 10 reps Prone Press Up On Elbows  2 sets, 10 reps Thread the needle 10 x bilat with foam roller for cues.  Rows with GTB 2 sets 10 reps  Shoulder extension GTB 2 sets 10 reps Doorway Pec Stretch at 60 Elevation  2  sets, 4 reps Manual Therapy: High velocity, low amplitude manip to thoracic region.   See HEP below.    PATIENT EDUCATION:  Education details: Educated pt on anatomy and physiology of current symptoms, FOTO, diagnosis, prognosis, HEP,  and POC.   Person educated: Patient Education method: Explanation, Demonstration, Tactile cues, Verbal cues, and Handouts Education comprehension: verbalized understanding  HOME EXERCISE PROGRAM: Access Code: [facility] URL: [email or web address] Date: 01/17/2022 Prepared by: Rudi Heap   Exercises - Seated Scapular Retraction  - 2 x daily - 7 x weekly - 2 sets - 10 reps - Shoulder External Rotation and Scapular Retraction with Resistance  - 2 x daily - 7 x weekly - 2 sets - 10 reps - Supine Chin Tuck  - 2 x daily - 7 x weekly - 2 sets - 10 reps - Prone Press Up On Elbows  - 1 x daily - 7 x weekly - 3 sets - 10 reps - Doorway Pec Stretch at 60 Elevation  - 2 x daily - 7 x weekly - 2 sets - 4 reps   01/30/2022 - Prone Shoulder Horizontal Abduction with Thumbs Up  - 2 x daily - 7 x weekly - 2 sets - 10 reps - Prone Single Arm Middle Trapezius Strengthening on Swiss Ball  - 2 x daily - 7 x weekly - 2 sets - 10 reps - Standing Thoracic Open Book at [place]  - 1 x daily - 7 x weekly - 3 sets - 10 reps  ASSESSMENT:   CLINICAL IMPRESSION: Patient presents to PT with no shoulder pain, but noted headaches for the past 3 days. Suggested to monitor headaches with weather changes due to barometric changes with rain recently. Pt able to complete all prescribed exercises today with increased focus on thoracic mobility. Pt notes fatigue with completion of session today, but no noted pain and a reduction in her headache to minimally notable. Encouraged pt to continue with strengthening at home. Plan to increase strengthening next session pending symptoms. Pt will continue to benefit from skilled PT to address continued deficits.   OBJECTIVE  IMPAIRMENTS decreased endurance, decreased strength, increased muscle spasms, impaired UE functional use, improper body mechanics, postural dysfunction, and pain.    ACTIVITY LIMITATIONS carrying, lifting, sleeping, and reach over head   PARTICIPATION LIMITATIONS: meal prep, cleaning, laundry, interpersonal relationship, driving, and shopping   PERSONAL FACTORS Fitness, Past/current experiences, Profession, and 1 comorbidity: Depression  are also affecting patient's functional outcome.    REHAB POTENTIAL: Good   CLINICAL DECISION MAKING: Stable/uncomplicated   EVALUATION COMPLEXITY: Low     GOALS: Goals reviewed with patient? No   SHORT TERM GOALS: Target date: 02/14/2022    Pt will be I and compliant with initial HEP. Baseline: provided at eval Goal status: Ongoing   2.  Pt will report a decrease in baseline pain to 4/10.  Baseline:  Goal status: MET 01/30/2022 4.  Pt will report 50% less headaches throughout the day.  Baseline:  Goal status: MET 01/30/2022     LONG TERM GOALS: Target date: 03/14/2022   Pt will be independent with advanced HEP to continue to address postural limitations and muscle imbalances. Baseline: not provided Goal status: ONGOING 2.  Pt will report <2/10 pain at baseline.  Baseline:  Goal status: ONGOING   3.  Pt will be able to vacuum without needing assistance.  Baseline:  Goal status: ONGOING   4.  Pt will be able to carry groceries in L hand without increased pain.  Baseline:  Goal status: ONGOING   5.  Pt will have 5/5 global strength for bilat UE's Baseline:  Goal status: ONGOING   PLAN: PT FREQUENCY: 2x/week   PT DURATION: 8 weeks   PLANNED INTERVENTIONS: Therapeutic exercises, Therapeutic activity, Neuromuscular re-education, Balance training, Gait training, Patient/Family education, Joint manipulation, Joint mobilization, Dry Needling, Electrical stimulation, Spinal  manipulation, Spinal mobilization, Cryotherapy, Moist heat,  Taping, Traction, Ultrasound, and Manual therapy   PLAN FOR NEXT SESSION: Assess HEP/update PRN, strengthen deep neck flexors, and shoulder girdle muscles. Decrease patients pain and help minimize functional mobility. Increase endurance and strength in parascapular musculature.    Lynden Ang, PT 02/02/2022, 9:02 AM   PHYSICAL THERAPY DISCHARGE SUMMARY  Visits from Start of Care: 4  Current functional level related to goals / functional outcomes: Improvements in functional ROM with decreased pain.    Remaining deficits: Intermittent headaches   Education / Equipment: Therabands   Patient agrees to discharge. Patient goals were partially met. Patient is being discharged due to not returning since the last visit.

## 2022-02-07 ENCOUNTER — Ambulatory Visit: Payer: BC Managed Care – PPO | Admitting: Physical Therapy

## 2022-02-07 ENCOUNTER — Encounter: Payer: Self-pay | Admitting: Physical Therapy

## 2022-02-07 DIAGNOSIS — M546 Pain in thoracic spine: Secondary | ICD-10-CM

## 2022-02-07 DIAGNOSIS — M542 Cervicalgia: Secondary | ICD-10-CM | POA: Diagnosis not present

## 2022-02-07 DIAGNOSIS — M25512 Pain in left shoulder: Secondary | ICD-10-CM

## 2022-02-07 NOTE — Therapy (Deleted)
OUTPATIENT PHYSICAL THERAPY TREATMENT NOTE   Patient Name: Amy Stanton MRN: [id number] DOB:22-Jan-1991, 31 y.o., female Today's Date: 02/07/2022  PCP: Eugenia Pancoast, FNP  REFERRING PROVIDER: Glennon Mac, DO  END OF SESSION:     Past Medical History:  Diagnosis Date   Depression    Past Surgical History:  Procedure Laterality Date   BILATERAL SALPINGECTOMY Bilateral 02/08/2021   pt chose to do this for suspected endometrosis   Patient Active Problem List   Diagnosis Date Noted   Dysmenorrhea 10/19/2021   Migraine without aura, intractable, without status migrainosus 09/26/2021   Neck pain on left side 09/26/2021   Nonintractable headache 09/15/2021   Chronic tension-type headache, not intractable 09/15/2021   Screening for cervical cancer 09/15/2021    REFERRING DIAG: Neck pain on left side [M54.2], Somatic dysfunction of cervical region [M99.01], Somatic dysfunction of thoracic region [M99.02], Somatic dysfunction of lumbar region [M99.03], Somatic dysfunction of pelvic region [M99.05], Somatic dysfunction of rib region [M99.08], Chronic tension-type headache, not intractable [G44.229]  THERAPY DIAG:  No diagnosis found.  Rationale for Evaluation and Treatment Rehabilitation  PERTINENT HISTORY: Neck pain on left side [M54.2], Somatic dysfunction of cervical region [M99.01], Somatic dysfunction of thoracic region [M99.02], Somatic dysfunction of lumbar region [M99.03], Somatic dysfunction of pelvic region [M99.05], Somatic dysfunction of rib region [M99.08], Chronic tension-type headache, not intractable [G44.229]  PRECAUTIONS: None  SUBJECTIVE: Pt states that she has not been having much shoulder pain, however she has had a headache for the past 3 days.   PAIN:  Are you having pain? Yes: NPRS scale: 4/10 Pain location: L shoulder into cervical/ thoracic spine.  Pain description: Dull ache that turns into sharp pain.  Aggravating factors: Driving, video  gaming, carrying, lifting OH, sleeping.  Relieving factors: Exercises prescribed by MD, utilization of pillow, heat,  meds, standing while working.    OBJECTIVE:    DIAGNOSTIC FINDINGS:  12/13/2021:   3 mm focus of signal abnormality and punctate  enhancement within the left parietal lobe, as described. While this may reflect a cavernoma, alternative etiologies (such as an isolated intracranial metastasis) are possible. An initial short-interval 6 week follow-up brain MRI without and with contrast is recommended for surveillance of this finding. Otherwise unremarkable MRI appearance of the brain. Small mucous retention cyst within the left sphenoid sinus.   01/13/2022:  1. 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 2. Otherwise unremarkable appearance of the brain.   PATIENT SURVEYS:  FOTO Initial 42, predicted 67     COGNITION: Overall cognitive status: Within functional limits for tasks assessed   SENSATION: WFL   POSTURE: rounded shoulders and forward head   PALPATION: L UT musculature,      CERVICAL ROM:    Active ROM A(deg) eval  Flexion WFL tight  Extension WFL  Right lateral flexion WFL  Left lateral flexion WFL  Right rotation WFL  Left rotation WFL   (Blank rows = not tested)   UPPER EXTREMITY ROM:   Active ROM Right eval Left eval  Shoulder flexion [facility] WFL with crepitus reported  Shoulder extension [facility]  Shoulder abduction [facility]  Shoulder extension [facility]  Shoulder internal rotation [facility]  Shoulder external rotation WFL WFL   (Blank rows = not tested)   UPPER EXTREMITY MMT:   MMT Right eval Left eval  Shoulder flexion 4+ 4+  Shoulder extension      Shoulder abduction 5 5  Shoulder internal rotation 5 5  Shoulder external rotation 4+ 4  Middle trapezius 4- 4-  Lower trapezius 4- 4-  Elbow flexion 5 5  Elbow extension 5 5   (Blank rows = not tested)    CERVICAL SPECIAL TESTS:  Upper limb tension test (ULTT): Positive L side only   TODAY'S TREATMENT:  [facility] Adult PT Treatment:                                                DATE: 02/07/2022 Therapeutic Exercise: UBE 2 fwd/ 2 bkwd Thread the needle 10 x bilat with foam roller for cues.  Cat/ cow 10 x Supine Chin Tuck  2 sets, 10 reps with ball against wall.  Horizontal Abduction with GTB 2 sets, 10 reps against wall Shoulder extension 2 sets 10 reps GTB Rows with GTB 2 sets 10 reps Supine pec stretch on half foam roller 1x 1 min hold.  Prone Press Up On Elbows  2 sets, 10 reps Thoracic extension over foam roller 10 reps, 5 sec hold.  Open books on wall 5x each side.   [place] Adult PT Treatment:                                                DATE: 01/30/2022 Therapeutic Exercise: UBE 2 fwd/ 2 bkwd Thread the needle 10 x bilat with foam roller for cues.  Supine Chin Tuck  2 sets, 10 reps Horizontal Abduction with RTB 2 sets, 10 reps Bilat Shoulder Ext RTB  2 sets, 10 reps Prone Y's 2 sets, 10 reps Prone T's 2 sets, 10 reps Prone Press Up On Elbows  2 sets, 10 reps Supine pec stretch on half foam roller 1x 1 min hold.  Thoracic extension over foam roller 10 reps, 5 sec hold.  Open books on wall 5x each side.  Rows with RTB 2 sets 10 reps  Shoulder extension 2 sets 10 reps Doorway Pec Stretch at 60 Elevation  2 sets, 4 reps Ball taps on wall [place] 1 min Manual Therapy: High velocity, low amplitude manip to thoracic region.   [place] Adult PT Treatment:                                                DATE: 01/26/2022 Therapeutic Exercise: UBE 2.5 fwd/ 2.5 bkwd Seated Scapular Retraction  2 sets, 10 reps Bilat Shoulder Ext RTB  2 sets, 10 reps Supine Chin Tuck  2 sets, 10 reps Prone Press Up On Elbows  2 sets, 10 reps Thread the needle 10 x bilat with foam roller for cues.  Rows with GTB 2 sets 10 reps  Shoulder extension GTB 2 sets 10 reps Doorway Pec Stretch at 60 Elevation  2  sets, 4 reps Manual Therapy: High velocity, low amplitude manip to thoracic region.   See HEP below.    PATIENT EDUCATION:  Education details: Educated pt on anatomy and physiology of current symptoms, FOTO, diagnosis, prognosis, HEP,  and POC.   Person educated: Patient Education method: Explanation, Demonstration, Tactile cues, Verbal cues, and Handouts Education comprehension: verbalized understanding  HOME EXERCISE PROGRAM: Access Code: [facility] URL: [email or web address] Date: 01/17/2022 Prepared by: Rudi Heap   Exercises - Seated Scapular Retraction  - 2 x daily - 7 x weekly - 2 sets - 10 reps - Shoulder External Rotation and Scapular Retraction with Resistance  - 2 x daily - 7 x weekly - 2 sets - 10 reps - Supine Chin Tuck  - 2 x daily - 7 x weekly - 2 sets - 10 reps - Prone Press Up On Elbows  - 1 x daily - 7 x weekly - 3 sets - 10 reps - Doorway Pec Stretch at 60 Elevation  - 2 x daily - 7 x weekly - 2 sets - 4 reps   01/30/2022 - Prone Shoulder Horizontal Abduction with Thumbs Up  - 2 x daily - 7 x weekly - 2 sets - 10 reps - Prone Single Arm Middle Trapezius Strengthening on Swiss Ball  - 2 x daily - 7 x weekly - 2 sets - 10 reps - Standing Thoracic Open Book at [place]  - 1 x daily - 7 x weekly - 3 sets - 10 reps  ASSESSMENT:   CLINICAL IMPRESSION: Patient presents to PT with no shoulder pain, but noted headaches for the past 3 days. Suggested to monitor headaches with weather changes due to barometric changes with rain recently. Pt able to complete all prescribed exercises today with increased focus on thoracic mobility. Pt notes fatigue with completion of session today, but no noted pain and a reduction in her headache to minimally notable. Encouraged pt to continue with strengthening at home. Plan to increase strengthening next session pending symptoms. Pt will continue to benefit from skilled PT to address continued deficits.   OBJECTIVE  IMPAIRMENTS decreased endurance, decreased strength, increased muscle spasms, impaired UE functional use, improper body mechanics, postural dysfunction, and pain.    ACTIVITY LIMITATIONS carrying, lifting, sleeping, and reach over head   PARTICIPATION LIMITATIONS: meal prep, cleaning, laundry, interpersonal relationship, driving, and shopping   PERSONAL FACTORS Fitness, Past/current experiences, Profession, and 1 comorbidity: Depression  are also affecting patient's functional outcome.    REHAB POTENTIAL: Good   CLINICAL DECISION MAKING: Stable/uncomplicated   EVALUATION COMPLEXITY: Low     GOALS: Goals reviewed with patient? No   SHORT TERM GOALS: Target date: 02/14/2022    Pt will be I and compliant with initial HEP. Baseline: provided at eval Goal status: Ongoing   2.  Pt will report a decrease in baseline pain to 4/10.  Baseline:  Goal status: MET 01/30/2022 4.  Pt will report 50% less headaches throughout the day.  Baseline:  Goal status: MET 01/30/2022     LONG TERM GOALS: Target date: 03/14/2022   Pt will be independent with advanced HEP to continue to address postural limitations and muscle imbalances. Baseline: not provided Goal status: INITIAL 2.  Pt will report <2/10 pain at baseline.  Baseline:  Goal status: INITIAL   3.  Pt will be able to vacuum without needing assistance.  Baseline:  Goal status: INITIAL   4.  Pt will be able to carry groceries in L hand without increased pain.  Baseline:  Goal status: INITIAL   5.  Pt will have 5/5 global strength for bilat UE's Baseline:  Goal status: INITIAL   PLAN: PT FREQUENCY: 2x/week   PT DURATION: 8 weeks   PLANNED INTERVENTIONS: Therapeutic exercises, Therapeutic activity, Neuromuscular re-education, Balance training, Gait training, Patient/Family education, Joint manipulation, Joint mobilization, Dry Needling, Electrical stimulation, Spinal  manipulation, Spinal mobilization, Cryotherapy, Moist heat,  Taping, Traction, Ultrasound, and Manual therapy   PLAN FOR NEXT SESSION: Assess HEP/update PRN, strengthen deep neck flexors, and shoulder girdle muscles. Decrease patients pain and help minimize functional mobility. Increase endurance and strength in parascapular musculature.    Lynden Ang, PT 02/07/2022, 12:39 PM

## 2022-02-09 ENCOUNTER — Ambulatory Visit: Payer: BC Managed Care – PPO | Admitting: Physical Therapy

## 2022-02-10 NOTE — Therapy (Deleted)
OUTPATIENT PHYSICAL THERAPY TREATMENT NOTE   Patient Name: Amy Stanton MRN: [id number] DOB:1990-12-25, 31 y.o., female Today's Date: 02/10/2022  PCP: Eugenia Pancoast, FNP  REFERRING PROVIDER: Glennon Mac, DO  END OF SESSION:     Past Medical History:  Diagnosis Date   Depression    Past Surgical History:  Procedure Laterality Date   BILATERAL SALPINGECTOMY Bilateral 02/08/2021   pt chose to do this for suspected endometrosis   Patient Active Problem List   Diagnosis Date Noted   Dysmenorrhea 10/19/2021   Migraine without aura, intractable, without status migrainosus 09/26/2021   Neck pain on left side 09/26/2021   Nonintractable headache 09/15/2021   Chronic tension-type headache, not intractable 09/15/2021   Screening for cervical cancer 09/15/2021    REFERRING DIAG: Neck pain on left side [M54.2], Somatic dysfunction of cervical region [M99.01], Somatic dysfunction of thoracic region [M99.02], Somatic dysfunction of lumbar region [M99.03], Somatic dysfunction of pelvic region [M99.05], Somatic dysfunction of rib region [M99.08], Chronic tension-type headache, not intractable [G44.229]  THERAPY DIAG:  No diagnosis found.  Rationale for Evaluation and Treatment Rehabilitation  PERTINENT HISTORY: Neck pain on left side [M54.2], Somatic dysfunction of cervical region [M99.01], Somatic dysfunction of thoracic region [M99.02], Somatic dysfunction of lumbar region [M99.03], Somatic dysfunction of pelvic region [M99.05], Somatic dysfunction of rib region [M99.08], Chronic tension-type headache, not intractable [G44.229]  PRECAUTIONS: None  SUBJECTIVE: Pt states that she has not been having much shoulder pain, however she has had a headache for the past 3 days.   PAIN:  Are you having pain? Yes: NPRS scale: 4/10 Pain location: L shoulder into cervical/ thoracic spine.  Pain description: Dull ache that turns into sharp pain.  Aggravating factors: Driving, video  gaming, carrying, lifting [place], sleeping.  Relieving factors: Exercises prescribed by MD, utilization of pillow, heat,  meds, standing while working.    OBJECTIVE:    DIAGNOSTIC FINDINGS:  12/13/2021:   3 mm focus of signal abnormality and punctate  enhancement within the left parietal lobe, as described. While this may reflect a cavernoma, alternative etiologies (such as an isolated intracranial metastasis) are possible. An initial short-interval 6 week follow-up brain MRI without and with contrast is recommended for surveillance of this finding. Otherwise unremarkable MRI appearance of the brain. Small mucous retention cyst within the left sphenoid sinus.   01/13/2022:  1. 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 2. Otherwise unremarkable appearance of the brain.   PATIENT SURVEYS:  FOTO Initial 42, predicted 36     COGNITION: Overall cognitive status: Within functional limits for tasks assessed   SENSATION: WFL   POSTURE: rounded shoulders and forward head   PALPATION: L UT musculature,      CERVICAL ROM:    Active ROM A(deg) eval  Flexion WFL tight  Extension WFL  Right lateral flexion WFL  Left lateral flexion WFL  Right rotation WFL  Left rotation WFL   (Blank rows = not tested)   UPPER EXTREMITY ROM:   Active ROM Right eval Left eval  Shoulder flexion [facility] WFL with crepitus reported  Shoulder extension [facility]  Shoulder abduction [facility]  Shoulder extension [facility]  Shoulder internal rotation [facility]  Shoulder external rotation WFL WFL   (Blank rows = not tested)   UPPER EXTREMITY MMT:   MMT Right eval Left eval  Shoulder flexion 4+ 4+  Shoulder extension      Shoulder abduction 5 5  Shoulder internal rotation 5 5  Shoulder external rotation 4+ 4  Middle trapezius 4- 4-  Lower trapezius 4- 4-  Elbow flexion 5 5  Elbow extension 5 5   (Blank rows = not tested)    CERVICAL SPECIAL TESTS:  Upper limb tension test (ULTT): Positive L side only   TODAY'S TREATMENT:  [facility] Adult PT Treatment:                                                DATE: 02/07/2022 Therapeutic Exercise: UBE 2 fwd/ 2 bkwd Thread the needle 10 x bilat with foam roller for cues.  Cat/ cow 10 x Supine Chin Tuck  2 sets, 10 reps with ball against wall.  Horizontal Abduction with GTB 2 sets, 10 reps against wall Shoulder extension 2 sets 10 reps GTB Rows with GTB 2 sets 10 reps Supine pec stretch on half foam roller 1x 1 min hold.  Prone Press Up On Elbows  2 sets, 10 reps Thoracic extension over foam roller 10 reps, 5 sec hold.  Open books on wall 5x each side.   [place] Adult PT Treatment:                                                DATE: 01/30/2022 Therapeutic Exercise: UBE 2 fwd/ 2 bkwd Thread the needle 10 x bilat with foam roller for cues.  Supine Chin Tuck  2 sets, 10 reps Horizontal Abduction with RTB 2 sets, 10 reps Bilat Shoulder Ext RTB  2 sets, 10 reps Prone Y's 2 sets, 10 reps Prone T's 2 sets, 10 reps Prone Press Up On Elbows  2 sets, 10 reps Supine pec stretch on half foam roller 1x 1 min hold.  Thoracic extension over foam roller 10 reps, 5 sec hold.  Open books on wall 5x each side.  Rows with RTB 2 sets 10 reps  Shoulder extension 2 sets 10 reps Doorway Pec Stretch at 60 Elevation  2 sets, 4 reps Ball taps on wall [place] 1 min Manual Therapy: High velocity, low amplitude manip to thoracic region.   [place] Adult PT Treatment:                                                DATE: 01/26/2022 Therapeutic Exercise: UBE 2.5 fwd/ 2.5 bkwd Seated Scapular Retraction  2 sets, 10 reps Bilat Shoulder Ext RTB  2 sets, 10 reps Supine Chin Tuck  2 sets, 10 reps Prone Press Up On Elbows  2 sets, 10 reps Thread the needle 10 x bilat with foam roller for cues.  Rows with GTB 2 sets 10 reps  Shoulder extension GTB 2 sets 10 reps Doorway Pec Stretch at 60 Elevation  2  sets, 4 reps Manual Therapy: High velocity, low amplitude manip to thoracic region.   See HEP below.    PATIENT EDUCATION:  Education details: Educated pt on anatomy and physiology of current symptoms, FOTO, diagnosis, prognosis, HEP,  and POC.   Person educated: Patient Education method: Explanation, Demonstration, Tactile cues, Verbal cues, and Handouts Education comprehension: verbalized understanding  HOME EXERCISE PROGRAM: Access Code: [facility] URL: [email or web address] Date: 01/17/2022 Prepared by: Rudi Heap   Exercises - Seated Scapular Retraction  - 2 x daily - 7 x weekly - 2 sets - 10 reps - Shoulder External Rotation and Scapular Retraction with Resistance  - 2 x daily - 7 x weekly - 2 sets - 10 reps - Supine Chin Tuck  - 2 x daily - 7 x weekly - 2 sets - 10 reps - Prone Press Up On Elbows  - 1 x daily - 7 x weekly - 3 sets - 10 reps - Doorway Pec Stretch at 60 Elevation  - 2 x daily - 7 x weekly - 2 sets - 4 reps   01/30/2022 - Prone Shoulder Horizontal Abduction with Thumbs Up  - 2 x daily - 7 x weekly - 2 sets - 10 reps - Prone Single Arm Middle Trapezius Strengthening on Swiss Ball  - 2 x daily - 7 x weekly - 2 sets - 10 reps - Standing Thoracic Open Book at [place]  - 1 x daily - 7 x weekly - 3 sets - 10 reps  ASSESSMENT:   CLINICAL IMPRESSION: Patient presents to PT with no shoulder pain, but noted headaches for the past 3 days. Suggested to monitor headaches with weather changes due to barometric changes with rain recently. Pt able to complete all prescribed exercises today with increased focus on thoracic mobility. Pt notes fatigue with completion of session today, but no noted pain and a reduction in her headache to minimally notable. Encouraged pt to continue with strengthening at home. Plan to increase strengthening next session pending symptoms. Pt will continue to benefit from skilled PT to address continued deficits.   OBJECTIVE  IMPAIRMENTS decreased endurance, decreased strength, increased muscle spasms, impaired UE functional use, improper body mechanics, postural dysfunction, and pain.    ACTIVITY LIMITATIONS carrying, lifting, sleeping, and reach over head   PARTICIPATION LIMITATIONS: meal prep, cleaning, laundry, interpersonal relationship, driving, and shopping   PERSONAL FACTORS Fitness, Past/current experiences, Profession, and 1 comorbidity: Depression  are also affecting patient's functional outcome.    REHAB POTENTIAL: Good   CLINICAL DECISION MAKING: Stable/uncomplicated   EVALUATION COMPLEXITY: Low     GOALS: Goals reviewed with patient? No   SHORT TERM GOALS: Target date: 02/14/2022    Pt will be I and compliant with initial HEP. Baseline: provided at eval Goal status: Ongoing   2.  Pt will report a decrease in baseline pain to 4/10.  Baseline:  Goal status: MET 01/30/2022 4.  Pt will report 50% less headaches throughout the day.  Baseline:  Goal status: MET 01/30/2022     LONG TERM GOALS: Target date: 03/14/2022   Pt will be independent with advanced HEP to continue to address postural limitations and muscle imbalances. Baseline: not provided Goal status: INITIAL 2.  Pt will report <2/10 pain at baseline.  Baseline:  Goal status: INITIAL   3.  Pt will be able to vacuum without needing assistance.  Baseline:  Goal status: INITIAL   4.  Pt will be able to carry groceries in L hand without increased pain.  Baseline:  Goal status: INITIAL   5.  Pt will have 5/5 global strength for bilat UE's Baseline:  Goal status: INITIAL   PLAN: PT FREQUENCY: 2x/week   PT DURATION: 8 weeks   PLANNED INTERVENTIONS: Therapeutic exercises, Therapeutic activity, Neuromuscular re-education, Balance training, Gait training, Patient/Family education, Joint manipulation, Joint mobilization, Dry Needling, Electrical stimulation, Spinal  manipulation, Spinal mobilization, Cryotherapy, Moist heat,  Taping, Traction, Ultrasound, and Manual therapy   PLAN FOR NEXT SESSION: Assess HEP/update PRN, strengthen deep neck flexors, and shoulder girdle muscles. Decrease patients pain and help minimize functional mobility. Increase endurance and strength in parascapular musculature.    Lynden Ang, PT 02/10/2022, 11:33 AM

## 2022-02-14 ENCOUNTER — Encounter: Payer: BC Managed Care – PPO | Admitting: Physical Therapy

## 2022-02-14 NOTE — Therapy (Deleted)
OUTPATIENT PHYSICAL THERAPY TREATMENT NOTE   Patient Name: Amy Stanton MRN: [id number] DOB:28-Oct-1990, 31 y.o., female Today's Date: 02/14/2022  PCP: Eugenia Pancoast, FNP  REFERRING PROVIDER: Glennon Mac, DO  END OF SESSION:     Past Medical History:  Diagnosis Date   Depression    Past Surgical History:  Procedure Laterality Date   BILATERAL SALPINGECTOMY Bilateral 02/08/2021   pt chose to do this for suspected endometrosis   Patient Active Problem List   Diagnosis Date Noted   Dysmenorrhea 10/19/2021   Migraine without aura, intractable, without status migrainosus 09/26/2021   Neck pain on left side 09/26/2021   Nonintractable headache 09/15/2021   Chronic tension-type headache, not intractable 09/15/2021   Screening for cervical cancer 09/15/2021    REFERRING DIAG: Neck pain on left side [M54.2], Somatic dysfunction of cervical region [M99.01], Somatic dysfunction of thoracic region [M99.02], Somatic dysfunction of lumbar region [M99.03], Somatic dysfunction of pelvic region [M99.05], Somatic dysfunction of rib region [M99.08], Chronic tension-type headache, not intractable [G44.229]  THERAPY DIAG:  No diagnosis found.  Rationale for Evaluation and Treatment Rehabilitation  PERTINENT HISTORY: Neck pain on left side [M54.2], Somatic dysfunction of cervical region [M99.01], Somatic dysfunction of thoracic region [M99.02], Somatic dysfunction of lumbar region [M99.03], Somatic dysfunction of pelvic region [M99.05], Somatic dysfunction of rib region [M99.08], Chronic tension-type headache, not intractable [G44.229]  PRECAUTIONS: None  SUBJECTIVE: Pt states that she has not been having much shoulder pain, however she has had a headache for the past 3 days.   PAIN:  Are you having pain? Yes: NPRS scale: 4/10 Pain location: L shoulder into cervical/ thoracic spine.  Pain description: Dull ache that turns into sharp pain.  Aggravating factors: Driving, video  gaming, carrying, lifting OH, sleeping.  Relieving factors: Exercises prescribed by MD, utilization of pillow, heat,  meds, standing while working.    OBJECTIVE:    DIAGNOSTIC FINDINGS:  12/13/2021:   3 mm focus of signal abnormality and punctate  enhancement within the left parietal lobe, as described. While this may reflect a cavernoma, alternative etiologies (such as an isolated intracranial metastasis) are possible. An initial short-interval 6 week follow-up brain MRI without and with contrast is recommended for surveillance of this finding. Otherwise unremarkable MRI appearance of the brain. Small mucous retention cyst within the left sphenoid sinus.   01/13/2022:  1. 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 2. Otherwise unremarkable appearance of the brain.   PATIENT SURVEYS:  FOTO Initial 42, predicted 53     COGNITION: Overall cognitive status: Within functional limits for tasks assessed   SENSATION: WFL   POSTURE: rounded shoulders and forward head   PALPATION: L UT musculature,      CERVICAL ROM:    Active ROM A(deg) eval  Flexion WFL tight  Extension WFL  Right lateral flexion WFL  Left lateral flexion WFL  Right rotation WFL  Left rotation WFL   (Blank rows = not tested)   UPPER EXTREMITY ROM:   Active ROM Right eval Left eval  Shoulder flexion [facility] WFL with crepitus reported  Shoulder extension [facility]  Shoulder abduction [facility]  Shoulder extension [facility]  Shoulder internal rotation [facility]  Shoulder external rotation WFL WFL   (Blank rows = not tested)   UPPER EXTREMITY MMT:   MMT Right eval Left eval  Shoulder flexion 4+ 4+  Shoulder extension      Shoulder abduction 5 5  Shoulder internal rotation 5 5  Shoulder external rotation 4+ 4  Middle trapezius 4- 4-  Lower trapezius 4- 4-  Elbow flexion 5 5  Elbow extension 5 5   (Blank rows = not tested)    CERVICAL SPECIAL TESTS:  Upper limb tension test (ULTT): Positive L side only   TODAY'S TREATMENT:  [facility] Adult PT Treatment:                                                DATE: 02/07/2022 Therapeutic Exercise: UBE 2 fwd/ 2 bkwd Thread the needle 10 x bilat with foam roller for cues.  Cat/ cow 10 x Supine Chin Tuck  2 sets, 10 reps with ball against wall.  Horizontal Abduction with GTB 2 sets, 10 reps against wall Shoulder extension 2 sets 10 reps GTB Rows with GTB 2 sets 10 reps Supine pec stretch on half foam roller 1x 1 min hold.  Prone Press Up On Elbows  2 sets, 10 reps Thoracic extension over foam roller 10 reps, 5 sec hold.  Open books on wall 5x each side.   [place] Adult PT Treatment:                                                DATE: 01/30/2022 Therapeutic Exercise: UBE 2 fwd/ 2 bkwd Thread the needle 10 x bilat with foam roller for cues.  Supine Chin Tuck  2 sets, 10 reps Horizontal Abduction with RTB 2 sets, 10 reps Bilat Shoulder Ext RTB  2 sets, 10 reps Prone Y's 2 sets, 10 reps Prone T's 2 sets, 10 reps Prone Press Up On Elbows  2 sets, 10 reps Supine pec stretch on half foam roller 1x 1 min hold.  Thoracic extension over foam roller 10 reps, 5 sec hold.  Open books on wall 5x each side.  Rows with RTB 2 sets 10 reps  Shoulder extension 2 sets 10 reps Doorway Pec Stretch at 60 Elevation  2 sets, 4 reps Ball taps on wall [place] 1 min Manual Therapy: High velocity, low amplitude manip to thoracic region.   [place] Adult PT Treatment:                                                DATE: 01/26/2022 Therapeutic Exercise: UBE 2.5 fwd/ 2.5 bkwd Seated Scapular Retraction  2 sets, 10 reps Bilat Shoulder Ext RTB  2 sets, 10 reps Supine Chin Tuck  2 sets, 10 reps Prone Press Up On Elbows  2 sets, 10 reps Thread the needle 10 x bilat with foam roller for cues.  Rows with GTB 2 sets 10 reps  Shoulder extension GTB 2 sets 10 reps Doorway Pec Stretch at 60 Elevation  2  sets, 4 reps Manual Therapy: High velocity, low amplitude manip to thoracic region.   See HEP below.    PATIENT EDUCATION:  Education details: Educated pt on anatomy and physiology of current symptoms, FOTO, diagnosis, prognosis, HEP,  and POC.   Person educated: Patient Education method: Explanation, Demonstration, Tactile cues, Verbal cues, and Handouts Education comprehension: verbalized understanding  HOME EXERCISE PROGRAM: Access Code: [facility] URL: [email or web address] Date: 01/17/2022 Prepared by: Rudi Heap   Exercises - Seated Scapular Retraction  - 2 x daily - 7 x weekly - 2 sets - 10 reps - Shoulder External Rotation and Scapular Retraction with Resistance  - 2 x daily - 7 x weekly - 2 sets - 10 reps - Supine Chin Tuck  - 2 x daily - 7 x weekly - 2 sets - 10 reps - Prone Press Up On Elbows  - 1 x daily - 7 x weekly - 3 sets - 10 reps - Doorway Pec Stretch at 60 Elevation  - 2 x daily - 7 x weekly - 2 sets - 4 reps   01/30/2022 - Prone Shoulder Horizontal Abduction with Thumbs Up  - 2 x daily - 7 x weekly - 2 sets - 10 reps - Prone Single Arm Middle Trapezius Strengthening on Swiss Ball  - 2 x daily - 7 x weekly - 2 sets - 10 reps - Standing Thoracic Open Book at [place]  - 1 x daily - 7 x weekly - 3 sets - 10 reps  ASSESSMENT:   CLINICAL IMPRESSION: Patient presents to PT with no shoulder pain, but noted headaches for the past 3 days. Suggested to monitor headaches with weather changes due to barometric changes with rain recently. Pt able to complete all prescribed exercises today with increased focus on thoracic mobility. Pt notes fatigue with completion of session today, but no noted pain and a reduction in her headache to minimally notable. Encouraged pt to continue with strengthening at home. Plan to increase strengthening next session pending symptoms. Pt will continue to benefit from skilled PT to address continued deficits.   OBJECTIVE  IMPAIRMENTS decreased endurance, decreased strength, increased muscle spasms, impaired UE functional use, improper body mechanics, postural dysfunction, and pain.    ACTIVITY LIMITATIONS carrying, lifting, sleeping, and reach over head   PARTICIPATION LIMITATIONS: meal prep, cleaning, laundry, interpersonal relationship, driving, and shopping   PERSONAL FACTORS Fitness, Past/current experiences, Profession, and 1 comorbidity: Depression  are also affecting patient's functional outcome.    REHAB POTENTIAL: Good   CLINICAL DECISION MAKING: Stable/uncomplicated   EVALUATION COMPLEXITY: Low     GOALS: Goals reviewed with patient? No   SHORT TERM GOALS: Target date: 02/14/2022    Pt will be I and compliant with initial HEP. Baseline: provided at eval Goal status: Ongoing   2.  Pt will report a decrease in baseline pain to 4/10.  Baseline:  Goal status: MET 01/30/2022 4.  Pt will report 50% less headaches throughout the day.  Baseline:  Goal status: MET 01/30/2022     LONG TERM GOALS: Target date: 03/14/2022   Pt will be independent with advanced HEP to continue to address postural limitations and muscle imbalances. Baseline: not provided Goal status: INITIAL 2.  Pt will report <2/10 pain at baseline.  Baseline:  Goal status: INITIAL   3.  Pt will be able to vacuum without needing assistance.  Baseline:  Goal status: INITIAL   4.  Pt will be able to carry groceries in L hand without increased pain.  Baseline:  Goal status: INITIAL   5.  Pt will have 5/5 global strength for bilat UE's Baseline:  Goal status: INITIAL   PLAN: PT FREQUENCY: 2x/week   PT DURATION: 8 weeks   PLANNED INTERVENTIONS: Therapeutic exercises, Therapeutic activity, Neuromuscular re-education, Balance training, Gait training, Patient/Family education, Joint manipulation, Joint mobilization, Dry Needling, Electrical stimulation, Spinal  manipulation, Spinal mobilization, Cryotherapy, Moist heat,  Taping, Traction, Ultrasound, and Manual therapy   PLAN FOR NEXT SESSION: Assess HEP/update PRN, strengthen deep neck flexors, and shoulder girdle muscles. Decrease patients pain and help minimize functional mobility. Increase endurance and strength in parascapular musculature.    Lynden Ang, PT 02/14/2022, 4:52 PM

## 2022-02-16 ENCOUNTER — Ambulatory Visit: Payer: BC Managed Care – PPO | Admitting: Physical Therapy

## 2022-02-20 ENCOUNTER — Other Ambulatory Visit: Payer: Self-pay | Admitting: Neurology

## 2022-02-20 NOTE — Therapy (Deleted)
OUTPATIENT PHYSICAL THERAPY TREATMENT NOTE   Patient Name: Amy Stanton MRN: [id number] DOB:1991-06-27, 31 y.o., female Today's Date: 02/20/2022  PCP: Eugenia Pancoast, FNP  REFERRING PROVIDER: Glennon Mac, DO  END OF SESSION:     Past Medical History:  Diagnosis Date   Depression    Past Surgical History:  Procedure Laterality Date   BILATERAL SALPINGECTOMY Bilateral 02/08/2021   pt chose to do this for suspected endometrosis   Patient Active Problem List   Diagnosis Date Noted   Dysmenorrhea 10/19/2021   Migraine without aura, intractable, without status migrainosus 09/26/2021   Neck pain on left side 09/26/2021   Nonintractable headache 09/15/2021   Chronic tension-type headache, not intractable 09/15/2021   Screening for cervical cancer 09/15/2021    REFERRING DIAG: Neck pain on left side [M54.2], Somatic dysfunction of cervical region [M99.01], Somatic dysfunction of thoracic region [M99.02], Somatic dysfunction of lumbar region [M99.03], Somatic dysfunction of pelvic region [M99.05], Somatic dysfunction of rib region [M99.08], Chronic tension-type headache, not intractable [G44.229]  THERAPY DIAG:  No diagnosis found.  Rationale for Evaluation and Treatment Rehabilitation  PERTINENT HISTORY: Neck pain on left side [M54.2], Somatic dysfunction of cervical region [M99.01], Somatic dysfunction of thoracic region [M99.02], Somatic dysfunction of lumbar region [M99.03], Somatic dysfunction of pelvic region [M99.05], Somatic dysfunction of rib region [M99.08], Chronic tension-type headache, not intractable [G44.229]  PRECAUTIONS: None  SUBJECTIVE: Pt states that she has not been having much shoulder pain, however she has had a headache for the past 3 days.   PAIN:  Are you having pain? Yes: NPRS scale: 4/10 Pain location: L shoulder into cervical/ thoracic spine.  Pain description: Dull ache that turns into sharp pain.  Aggravating factors: Driving, video  gaming, carrying, lifting [place], sleeping.  Relieving factors: Exercises prescribed by MD, utilization of pillow, heat,  meds, standing while working.    OBJECTIVE:    DIAGNOSTIC FINDINGS:  12/13/2021:   3 mm focus of signal abnormality and punctate  enhancement within the left parietal lobe, as described. While this may reflect a cavernoma, alternative etiologies (such as an isolated intracranial metastasis) are possible. An initial short-interval 6 week follow-up brain MRI without and with contrast is recommended for surveillance of this finding. Otherwise unremarkable MRI appearance of the brain. Small mucous retention cyst within the left sphenoid sinus.   01/13/2022:  1. 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 2. Otherwise unremarkable appearance of the brain.   PATIENT SURVEYS:  FOTO Initial 42, predicted 81     COGNITION: Overall cognitive status: Within functional limits for tasks assessed   SENSATION: WFL   POSTURE: rounded shoulders and forward head   PALPATION: L UT musculature,      CERVICAL ROM:    Active ROM A(deg) eval  Flexion WFL tight  Extension WFL  Right lateral flexion WFL  Left lateral flexion WFL  Right rotation WFL  Left rotation WFL   (Blank rows = not tested)   UPPER EXTREMITY ROM:   Active ROM Right eval Left eval  Shoulder flexion [facility] WFL with crepitus reported  Shoulder extension [facility]  Shoulder abduction [facility]  Shoulder extension [facility]  Shoulder internal rotation [facility]  Shoulder external rotation WFL WFL   (Blank rows = not tested)   UPPER EXTREMITY MMT:   MMT Right eval Left eval  Shoulder flexion 4+ 4+  Shoulder extension      Shoulder abduction 5 5  Shoulder internal rotation 5 5  Shoulder external rotation 4+ 4  Middle trapezius 4- 4-  Lower trapezius 4- 4-  Elbow flexion 5 5  Elbow extension 5 5   (Blank rows = not tested)    CERVICAL SPECIAL TESTS:  Upper limb tension test (ULTT): Positive L side only   TODAY'S TREATMENT:  [facility] Adult PT Treatment:                                                DATE: 02/07/2022 Therapeutic Exercise: UBE 2 fwd/ 2 bkwd Thread the needle 10 x bilat with foam roller for cues.  Cat/ cow 10 x Supine Chin Tuck  2 sets, 10 reps with ball against wall.  Horizontal Abduction with GTB 2 sets, 10 reps against wall Shoulder extension 2 sets 10 reps GTB Rows with GTB 2 sets 10 reps Supine pec stretch on half foam roller 1x 1 min hold.  Prone Press Up On Elbows  2 sets, 10 reps Thoracic extension over foam roller 10 reps, 5 sec hold.  Open books on wall 5x each side.   [place] Adult PT Treatment:                                                DATE: 01/30/2022 Therapeutic Exercise: UBE 2 fwd/ 2 bkwd Thread the needle 10 x bilat with foam roller for cues.  Supine Chin Tuck  2 sets, 10 reps Horizontal Abduction with RTB 2 sets, 10 reps Bilat Shoulder Ext RTB  2 sets, 10 reps Prone Y's 2 sets, 10 reps Prone T's 2 sets, 10 reps Prone Press Up On Elbows  2 sets, 10 reps Supine pec stretch on half foam roller 1x 1 min hold.  Thoracic extension over foam roller 10 reps, 5 sec hold.  Open books on wall 5x each side.  Rows with RTB 2 sets 10 reps  Shoulder extension 2 sets 10 reps Doorway Pec Stretch at 60 Elevation  2 sets, 4 reps Ball taps on wall [place] 1 min Manual Therapy: High velocity, low amplitude manip to thoracic region.   [place] Adult PT Treatment:                                                DATE: 01/26/2022 Therapeutic Exercise: UBE 2.5 fwd/ 2.5 bkwd Seated Scapular Retraction  2 sets, 10 reps Bilat Shoulder Ext RTB  2 sets, 10 reps Supine Chin Tuck  2 sets, 10 reps Prone Press Up On Elbows  2 sets, 10 reps Thread the needle 10 x bilat with foam roller for cues.  Rows with GTB 2 sets 10 reps  Shoulder extension GTB 2 sets 10 reps Doorway Pec Stretch at 60 Elevation  2  sets, 4 reps Manual Therapy: High velocity, low amplitude manip to thoracic region.   See HEP below.    PATIENT EDUCATION:  Education details: Educated pt on anatomy and physiology of current symptoms, FOTO, diagnosis, prognosis, HEP,  and POC.   Person educated: Patient Education method: Explanation, Demonstration, Tactile cues, Verbal cues, and Handouts Education comprehension: verbalized understanding  HOME EXERCISE PROGRAM: Access Code: [facility] URL: [email or web address] Date: 01/17/2022 Prepared by: Rudi Heap   Exercises - Seated Scapular Retraction  - 2 x daily - 7 x weekly - 2 sets - 10 reps - Shoulder External Rotation and Scapular Retraction with Resistance  - 2 x daily - 7 x weekly - 2 sets - 10 reps - Supine Chin Tuck  - 2 x daily - 7 x weekly - 2 sets - 10 reps - Prone Press Up On Elbows  - 1 x daily - 7 x weekly - 3 sets - 10 reps - Doorway Pec Stretch at 60 Elevation  - 2 x daily - 7 x weekly - 2 sets - 4 reps   01/30/2022 - Prone Shoulder Horizontal Abduction with Thumbs Up  - 2 x daily - 7 x weekly - 2 sets - 10 reps - Prone Single Arm Middle Trapezius Strengthening on Swiss Ball  - 2 x daily - 7 x weekly - 2 sets - 10 reps - Standing Thoracic Open Book at [place]  - 1 x daily - 7 x weekly - 3 sets - 10 reps  ASSESSMENT:   CLINICAL IMPRESSION: Patient presents to PT with no shoulder pain, but noted headaches for the past 3 days. Suggested to monitor headaches with weather changes due to barometric changes with rain recently. Pt able to complete all prescribed exercises today with increased focus on thoracic mobility. Pt notes fatigue with completion of session today, but no noted pain and a reduction in her headache to minimally notable. Encouraged pt to continue with strengthening at home. Plan to increase strengthening next session pending symptoms. Pt will continue to benefit from skilled PT to address continued deficits.   OBJECTIVE  IMPAIRMENTS decreased endurance, decreased strength, increased muscle spasms, impaired UE functional use, improper body mechanics, postural dysfunction, and pain.    ACTIVITY LIMITATIONS carrying, lifting, sleeping, and reach over head   PARTICIPATION LIMITATIONS: meal prep, cleaning, laundry, interpersonal relationship, driving, and shopping   PERSONAL FACTORS Fitness, Past/current experiences, Profession, and 1 comorbidity: Depression  are also affecting patient's functional outcome.    REHAB POTENTIAL: Good   CLINICAL DECISION MAKING: Stable/uncomplicated   EVALUATION COMPLEXITY: Low     GOALS: Goals reviewed with patient? No   SHORT TERM GOALS: Target date: 02/14/2022    Pt will be I and compliant with initial HEP. Baseline: provided at eval Goal status: Ongoing   2.  Pt will report a decrease in baseline pain to 4/10.  Baseline:  Goal status: MET 01/30/2022 4.  Pt will report 50% less headaches throughout the day.  Baseline:  Goal status: MET 01/30/2022     LONG TERM GOALS: Target date: 03/14/2022   Pt will be independent with advanced HEP to continue to address postural limitations and muscle imbalances. Baseline: not provided Goal status: INITIAL 2.  Pt will report <2/10 pain at baseline.  Baseline:  Goal status: INITIAL   3.  Pt will be able to vacuum without needing assistance.  Baseline:  Goal status: INITIAL   4.  Pt will be able to carry groceries in L hand without increased pain.  Baseline:  Goal status: INITIAL   5.  Pt will have 5/5 global strength for bilat UE's Baseline:  Goal status: INITIAL   PLAN: PT FREQUENCY: 2x/week   PT DURATION: 8 weeks   PLANNED INTERVENTIONS: Therapeutic exercises, Therapeutic activity, Neuromuscular re-education, Balance training, Gait training, Patient/Family education, Joint manipulation, Joint mobilization, Dry Needling, Electrical stimulation, Spinal  manipulation, Spinal mobilization, Cryotherapy, Moist heat,  Taping, Traction, Ultrasound, and Manual therapy   PLAN FOR NEXT SESSION: Assess HEP/update PRN, strengthen deep neck flexors, and shoulder girdle muscles. Decrease patients pain and help minimize functional mobility. Increase endurance and strength in parascapular musculature.    Lynden Ang, PT 02/20/2022, 4:05 PM

## 2022-02-21 ENCOUNTER — Other Ambulatory Visit: Payer: Self-pay | Admitting: Neurology

## 2022-02-21 ENCOUNTER — Encounter: Payer: BC Managed Care – PPO | Admitting: Physical Therapy

## 2022-02-23 ENCOUNTER — Ambulatory Visit: Payer: BC Managed Care – PPO | Admitting: Physical Therapy

## 2022-03-22 ENCOUNTER — Encounter: Payer: Self-pay | Admitting: Neurology

## 2022-03-27 ENCOUNTER — Ambulatory Visit: Payer: BC Managed Care – PPO | Admitting: Neurology

## 2022-06-19 NOTE — Progress Notes (Deleted)
NEUROLOGY FOLLOW UP OFFICE NOTE  Amy Stanton [id number]  Assessment/Plan:   Chronic migraine without aura, without status migrainosus, intractable ***   Migraine prevention:  increase topiramate to 50mg  at bedtime. If no improvement in 4 weeks, she will contact me and will increase dose to 100mg  at bedtime Migraine rescue:  rizatriptan 10mg  and Zofran 8mg  Limit use of pain relievers to no more than 2 days out of week to prevent risk of rebound or medication-overuse headache. Keep headache diary Follow up 4-5 months.       Subjective:  Amy Stanton is a 31 year old right-handed female who follows up for headache.  She is accompanied by her husband.   UPDATE: MRI of brain with and without contrast on 12/13/2021 personally reviewed revealed 3 mm focus of signal abnormality with punctate enhancement within the left parietal lobe.  Follow up MRI of brain with and without contrast on 01/14/2022 personally reviewed was unchanged, most consistent with a benign cavernoma  Seen by Dr. Karmen Stabs of [facility].  Underwent physical therapy for neck pain.  ***  Intensity:  *** Duration:  *** Frequency:  *** Frequency of abortive medication: *** Current NSAIDS/analgesics:  none Current triptans:  rizatriptan 10mg  Current ergotamine:  none Current anti-emetic:  Zofran 4mg  Current muscle relaxants:  none Current Antihypertensive medications:  none Current Antidepressant medications:  none Current Anticonvulsant medications:  topiramate 50mg  daily Current anti-CGRP:  none Current Vitamins/Herbal/Supplements:  none Current Antihistamines/Decongestants:  loratadine Other therapy:  none Hormone/birth control:  none  Caffeine:  1 to 2 cups of coffee daily, energy drink 2 a week Alcohol:  no Smoker:  marijuana Diet:  64 oz of water daily.  Usually does not skip meals.  Not necessarily healthy foods (egg rolls) Exercise:  upper body stretching, yoga Depression:  yes; Anxiety:   yes Other pain:  some back pain, leg pain, shoulder pain Sleep hygiene:  ok  HISTORY:  Onset:  2022.  Progressively more severe and more frequent. Location:  across occipital region, when flares up diffuse like a cap Quality:  pressure/squeezing, throbbing Intensity:  severe 8-8.5/10 (once 9-10/10).  Jean Rosenthal:  absent Prodrome:  absent Associated symptoms:  Nausea and vomiting when severe, sometimes phonophobia, lethargy.  She denies associated photophobia, unilateral numbness or weakness. Duration:  off and on in waves over 24 hours (sometimes 48 hours) Frequency:  4-5 days. 1 severe headache a week Frequency of abortive medication: none Triggers:  unknown Relieving factors:  none Activity:  movement aggravates These headaches are often debilitating.  Was seen in Urgent Care a week ago and received an injection.  No headache since then.      Past NSAIDS/analgesics:  acetaminophen, Fioricet, Excedrin, ibuprofen Past abortive triptans:  sumatriptan tab Past abortive ergotamine:  none Past muscle relaxants:  none Past anti-emetic:  none Past antihypertensive medications:  none Past antidepressant medications:  sertraline Past anticonvulsant medications:  none Past anti-CGRP:  none Past vitamins/Herbal/Supplements:  none Past antihistamines/decongestants:  none Other past therapies:  none    Family history of headache:  mom (probably migraines)  PAST MEDICAL HISTORY: Past Medical History:  Diagnosis Date   Depression     MEDICATIONS: Current Outpatient Medications on File Prior to Visit  Medication Sig Dispense Refill   cyclobenzaprine (FLEXERIL) 5 MG tablet Take 1 tablet (5 mg total) by mouth at bedtime as needed for muscle spasms. 15 tablet 0   ibuprofen (ADVIL) 800 MG tablet Take 1 tablet (800 mg total) by mouth every  8 (eight) hours as needed. 30 tablet 1   loratadine (CLARITIN) 10 MG tablet Take 10 mg by mouth daily.     ondansetron (ZOFRAN) 8 MG tablet Take 1 tablet  (8 mg total) by mouth every 8 (eight) hours as needed for nausea or vomiting. (Patient not taking: Reported on 01/17/2022) 20 tablet 5   rizatriptan (MAXALT) 10 MG tablet Take 1 tablet earliest onset of headache.  May repeat in 2 hours if needed.  Maximum 2 tablets in 24 hours. 10 tablet 5   topiramate (TOPAMAX) 100 MG tablet TAKE 1 TABLET(100 MG) BY MOUTH AT BEDTIME 30 tablet 0   Ubrogepant (UBRELVY) 100 MG TABS Take 100 mg by mouth as needed (as needed. May repeat after 2 hours. Max 2 tablets in 24 hours.). 16 tablet 5   No current facility-administered medications on file prior to visit.    ALLERGIES: Allergies  Allergen Reactions   Monistat [Miconazole]    Penicillins     FAMILY HISTORY: Family History  Problem Relation Age of Onset   Arthritis Mother    Migraines Mother    Sleep apnea Mother    Migraines Maternal Grandmother    Seizures Maternal Grandmother        not epilepsy      Objective:  *** General: No acute distress.  Patient appears ***-groomed.   Head:  Normocephalic/atraumatic Eyes:  Fundi examined but not visualized Neck: supple, no paraspinal tenderness, full range of motion Heart:  Regular rate and rhythm Lungs:  Clear to auscultation bilaterally Back: No paraspinal tenderness Neurological Exam: alert and oriented to person, place, and time.  Speech fluent and not dysarthric, language intact.  CN II-XII intact. Bulk and tone normal, muscle strength 5/5 throughout.  Sensation to light touch intact.  Deep tendon reflexes 2+ throughout, toes downgoing.  Finger to nose testing intact.  Gait normal, Romberg negative.   Metta Clines, DO  CC: ***      \F2

## 2022-06-20 ENCOUNTER — Ambulatory Visit: Payer: BC Managed Care – PPO | Admitting: Neurology

## 2022-09-29 ENCOUNTER — Other Ambulatory Visit: Payer: Self-pay | Admitting: Obstetrics and Gynecology

## 2022-09-29 DIAGNOSIS — N946 Dysmenorrhea, unspecified: Secondary | ICD-10-CM

## 2022-11-20 ENCOUNTER — Encounter: Payer: Self-pay | Admitting: Obstetrics and Gynecology

## 2023-01-08 ENCOUNTER — Other Ambulatory Visit (HOSPITAL_COMMUNITY): Payer: Self-pay

## 2023-01-08 ENCOUNTER — Telehealth: Payer: Self-pay | Admitting: Pharmacy Technician

## 2023-01-08 NOTE — Telephone Encounter (Signed)
Patient Advocate Encounter  Prior Authorization for UBRELVY 100MG  has been approved with [employer].    PA# :[id number] Effective dates: 5.11.24 through 6.10.25  Per [facility] test claim, copay for 30 days supply is $0 w/eVoucher   Received notification from [employer] that prior authorization for UBRELVY 100MG  is required.   PA submitted on 6.10.24 Key [id number] Status is pending

## 2023-01-10 NOTE — Progress Notes (Signed)
PCP:  Mort Sawyers, FNP   Chief Complaint  Patient presents with   Gynecologic Exam    Severe cramping during cycles     HPI:      Ms. Amy Stanton is a 32 y.o. G0P0000 whose LMP was Patient's last menstrual period was 12/16/2022 (exact date)., presents today for her annual examination.  Her menses are regular every 28-30 days, lasting 5-6 days, mod to heavy flow, with nickel to quarter sized clots.  Dysmenorrhea moderate to severe, worse past few months. Has significant LT leg pain with menses. Takes ibup 800 mg as sparingly as possible (due to GI issues/GERD/IBS) which usually makes them tolerable; had 1 episode a few months ago that she could barely walk due to pain. Has Rx flexeril for other MSK issue; hasn't tried it for dysmen. S/p BS in past and no endometriosis seen per MD, but wasn't looking for it specifically. Pt had n/v with OCPs in past, side effect with xulane, and BV with nuvaring. Not interested in depo, nexplanon, IUD.   Sex activity: single partner, contraception - TL. No pain/ bleeding with sex.  Last Pap: 10/19/21 Results were no abnormalities/neg HPV DNA; no hx of abn paps.   There is no FH of breast cancer. There is no FH of ovarian cancer. The patient does do self-breast exams. Hx of fibrocystic breasts with neg breast u/s in past.   Tobacco use: The patient denies current or previous tobacco use. Alcohol use: none Daily marijuana use for GI sx.  Exercise: not active  She does not get adequate calcium and Vitamin D in her diet. Not taking supp.  Having issues with RLS. No recent CBC. Tried magnesium 200 mg without relief.   Patient Active Problem List   Diagnosis Date Noted   Menometrorrhagia 01/11/2023   Dysmenorrhea 10/19/2021   Migraine without aura, intractable, without status migrainosus 09/26/2021   Neck pain on left side 09/26/2021   Nonintractable headache 09/15/2021   Chronic tension-type headache, not intractable 09/15/2021   Screening for  cervical cancer 09/15/2021    Past Surgical History:  Procedure Laterality Date   BILATERAL SALPINGECTOMY Bilateral 02/08/2021   pt chose to do this for suspected endometrosis    Family History  Problem Relation Age of Onset   Arthritis Mother    Migraines Mother    Sleep apnea Mother    Migraines Maternal Grandmother    Seizures Maternal Grandmother        not epilepsy    Social History   Socioeconomic History   Marital status: Married    Spouse name: david   Number of children: 0   Years of education: Not on file   Highest education level: Not on file  Occupational History   Not on file  Tobacco Use   Smoking status: Never   Smokeless tobacco: Never  Vaping Use   Vaping Use: Never used  Substance and Sexual Activity   Alcohol use: Never   Drug use: Yes    Types: Marijuana    Comment: inhalation, daily, helps her regulate her anxiety/intestinal issues   Sexual activity: Yes    Partners: Male    Birth control/protection: Surgical  Other Topics Concern   Not on file  Social History Narrative   Home maker    Right handed   Social Determinants of Health   Financial Resource Strain: Not on file  Food Insecurity: Not on file  Transportation Needs: Not on file  Physical Activity: Not on file  Stress: Not on file  Social Connections: Not on file  Intimate Partner Violence: Not on file     Current Outpatient Medications:    cyclobenzaprine (FLEXERIL) 5 MG tablet, Take 1 tablet (5 mg total) by mouth at bedtime as needed for muscle spasms., Disp: 15 tablet, Rfl: 0   loratadine (CLARITIN) 10 MG tablet, Take 10 mg by mouth daily., Disp: , Rfl:    Ubrogepant (UBRELVY) 100 MG TABS, Take 100 mg by mouth as needed (as needed. May repeat after 2 hours. Max 2 tablets in 24 hours.)., Disp: 16 tablet, Rfl: 5   ibuprofen (ADVIL) 800 MG tablet, Take 1 tablet (800 mg total) by mouth every 8 (eight) hours as needed., Disp: 30 tablet, Rfl: 1     ROS:  Review of Systems   Constitutional:  Negative for fatigue, fever and unexpected weight change.  Respiratory:  Negative for cough, shortness of breath and wheezing.   Cardiovascular:  Negative for chest pain, palpitations and leg swelling.  Gastrointestinal:  Negative for blood in stool, constipation, diarrhea, nausea and vomiting.  Endocrine: Negative for cold intolerance, heat intolerance and polyuria.  Genitourinary:  Negative for dyspareunia, dysuria, flank pain, frequency, genital sores, hematuria, menstrual problem, pelvic pain, urgency, vaginal bleeding, vaginal discharge and vaginal pain.  Musculoskeletal:  Positive for arthralgias. Negative for back pain, joint swelling and myalgias.  Skin:  Negative for rash.  Neurological:  Positive for headaches. Negative for dizziness, syncope, light-headedness and numbness.  Hematological:  Negative for adenopathy.  Psychiatric/Behavioral:  Positive for agitation and dysphoric mood. Negative for confusion, sleep disturbance and suicidal ideas. The patient is not nervous/anxious.    BREAST: No symptoms   Objective: BP 104/60   Ht 5\' 2"  (1.575 m)   Wt 182 lb (82.6 kg)   LMP 12/16/2022 (Exact Date)   BMI 33.29 kg/m    Physical Exam Constitutional:      Appearance: She is well-developed.  Genitourinary:     Vulva normal.     Right Labia: No rash, tenderness or lesions.    Left Labia: No tenderness, lesions or rash.    No vaginal discharge, erythema or tenderness.      Right Adnexa: not tender and no mass present.    Left Adnexa: not tender and no mass present.    No cervical friability or polyp.     Uterus is not enlarged or tender.  Breasts:    Right: No mass, nipple discharge, skin change or tenderness.     Left: No mass, nipple discharge, skin change or tenderness.  Neck:     Thyroid: No thyromegaly.  Cardiovascular:     Rate and Rhythm: Normal rate and regular rhythm.     Heart sounds: Normal heart sounds. No murmur heard. Pulmonary:      Effort: Pulmonary effort is normal.     Breath sounds: Normal breath sounds.  Abdominal:     Palpations: Abdomen is soft.     Tenderness: There is no abdominal tenderness. There is no guarding or rebound.  Musculoskeletal:        General: Normal range of motion.     Cervical back: Normal range of motion.  Lymphadenopathy:     Cervical: No cervical adenopathy.  Neurological:     General: No focal deficit present.     Mental Status: She is alert and oriented to person, place, and time.     Cranial Nerves: No cranial nerve deficit.  Skin:    General: Skin is warm and  dry.  Psychiatric:        Mood and Affect: Mood normal.        Behavior: Behavior normal.        Thought Content: Thought content normal.        Judgment: Judgment normal.  Vitals reviewed.     Assessment/Plan: Encounter for annual routine gynecological examination  Menometrorrhagia - Plan: CBC with Differential/Platelet, Iron, TIBC and Ferritin Panel, US PELVIS TRANSVAGINAL NON-OB (TV ONLY), ibuprofen (ADVIL) 800 MG tablet; check GYN u/s. If WNL, discussed cont ibup and taking it sooner, try flexeril with it as well. Pt not interested in [facility]. Could try ablation (not ideal candidate) vs hyst. If has leio, can do UFE. Increase exercise.  Restless Leg syndrome--check CBC; will f/u with results.    Meds ordered this encounter  Medications   ibuprofen (ADVIL) 800 MG tablet    Sig: Take 1 tablet (800 mg total) by mouth every 8 (eight) hours as needed.    Dispense:  30 tablet    Refill:  1    Order Specific Question:   Supervising Provider    Answer:   Waymon Budge             GYN counsel adequate intake of calcium and vitamin D, diet and exercise     F/U  Return in about 1 day (around 01/12/2023) for GYN u/s for menometrorrhagai--[facility] to call pt.  Jagger Beahm B. Kaydin Karbowski, PA-C 01/11/2023 10:40 AM

## 2023-01-11 ENCOUNTER — Ambulatory Visit (INDEPENDENT_AMBULATORY_CARE_PROVIDER_SITE_OTHER): Payer: BC Managed Care – PPO | Admitting: Obstetrics and Gynecology

## 2023-01-11 ENCOUNTER — Encounter: Payer: Self-pay | Admitting: Obstetrics and Gynecology

## 2023-01-11 VITALS — BP 104/60 | Ht 62.0 in | Wt 182.0 lb

## 2023-01-11 DIAGNOSIS — Z01419 Encounter for gynecological examination (general) (routine) without abnormal findings: Secondary | ICD-10-CM

## 2023-01-11 DIAGNOSIS — N946 Dysmenorrhea, unspecified: Secondary | ICD-10-CM

## 2023-01-11 DIAGNOSIS — N921 Excessive and frequent menstruation with irregular cycle: Secondary | ICD-10-CM

## 2023-01-11 MED ORDER — IBUPROFEN 800 MG PO TABS: 800.0000 mg | ORAL_TABLET | Freq: Three times a day (TID) | ORAL | 1 refills | Status: AC | PRN

## 2023-01-11 NOTE — Patient Instructions (Signed)
I value your feedback and you entrusting us with your care. If you get a [facility] patient survey, I would appreciate you taking the time to let us know about your experience today. Thank you! ? ? ?

## 2023-01-12 LAB — CBC WITH DIFFERENTIAL/PLATELET
Basophils Absolute: 0 10*3/uL (ref 0.0–0.2)
Basos: 1 %
EOS (ABSOLUTE): 0.3 10*3/uL (ref 0.0–0.4)
Eos: 5 %
Hematocrit: 41.4 % (ref 34.0–46.6)
Hemoglobin: 13.4 g/dL (ref 11.1–15.9)
Immature Grans (Abs): 0 10*3/uL (ref 0.0–0.1)
Immature Granulocytes: 0 %
Lymphocytes Absolute: 1.9 10*3/uL (ref 0.7–3.1)
Lymphs: 34 %
MCH: 31.2 pg (ref 26.6–33.0)
MCHC: 32.4 g/dL (ref 31.5–35.7)
MCV: 97 fL (ref 79–97)
Monocytes Absolute: 0.4 10*3/uL (ref 0.1–0.9)
Monocytes: 7 %
Neutrophils Absolute: 3 10*3/uL (ref 1.4–7.0)
Neutrophils: 53 %
Platelets: 192 10*3/uL (ref 150–450)
RBC: 4.29 x10E6/uL (ref 3.77–5.28)
RDW: 12.4 % (ref 11.7–15.4)
WBC: 5.6 10*3/uL (ref 3.4–10.8)

## 2023-01-12 LAB — IRON,TIBC AND FERRITIN PANEL
Ferritin: 27 ng/mL (ref 15–150)
Iron Saturation: 32 % (ref 15–55)
Iron: 88 ug/dL (ref 27–159)
Total Iron Binding Capacity: 278 ug/dL (ref 250–450)
UIBC: 190 ug/dL (ref 131–425)

## 2023-02-08 ENCOUNTER — Other Ambulatory Visit: Payer: BC Managed Care – PPO

## 2023-02-15 ENCOUNTER — Ambulatory Visit: Payer: BC Managed Care – PPO

## 2023-02-15 ENCOUNTER — Encounter: Payer: Self-pay | Admitting: Obstetrics and Gynecology

## 2023-03-05 ENCOUNTER — Other Ambulatory Visit: Payer: Self-pay

## 2023-03-05 ENCOUNTER — Other Ambulatory Visit: Payer: Self-pay | Admitting: Neurology

## 2023-03-05 DIAGNOSIS — R202 Paresthesia of skin: Secondary | ICD-10-CM

## 2023-03-09 ENCOUNTER — Telehealth: Payer: Self-pay | Admitting: Neurology

## 2023-03-09 ENCOUNTER — Encounter: Payer: Self-pay | Admitting: Family

## 2023-03-09 ENCOUNTER — Other Ambulatory Visit: Payer: Self-pay

## 2023-03-09 DIAGNOSIS — R519 Headache, unspecified: Secondary | ICD-10-CM

## 2023-03-09 DIAGNOSIS — G44229 Chronic tension-type headache, not intractable: Secondary | ICD-10-CM

## 2023-03-09 DIAGNOSIS — G43019 Migraine without aura, intractable, without status migrainosus: Secondary | ICD-10-CM

## 2023-03-09 MED ORDER — UBRELVY 100 MG PO TABS: 100.0000 mg | ORAL_TABLET | ORAL | 2 refills | Status: DC | PRN

## 2023-03-09 NOTE — Telephone Encounter (Signed)
Error/ltd ° °

## 2023-03-09 NOTE — Telephone Encounter (Signed)
Pt is calling in very upset that her refill was denied. Pts last visit was on 11/21/2021 and stated that she only has 2 more pills on hand and since she has to come in the office for an appointment for a refill she will be looking for her another neurologist.  She did make an appt for February 2025 but stated that is too far out.

## 2023-03-19 ENCOUNTER — Encounter: Payer: Self-pay | Admitting: Family

## 2023-09-06 NOTE — Progress Notes (Signed)
 NEUROLOGY FOLLOW UP OFFICE NOTE  Ilayda Toda [id number]  Assessment/Plan:   Migraine without aura, without status migrainosus, not intractable Left sided musculoskeletal pain   Migraine prevention:  Plan to start Aimovig  140mg  every 28 days Migraine rescue:  ibuprofen  - limit to no more than 9 days out of the month to prevent rebound headache Keep headache diary Follow up with Sports Medicine to address treatment of left sided musculoskeletal pain Follow up with me in 6 months.  Total time in chart and face to face with patient:  35 minutes  Subjective:  Venice Marcucci is a 33 year old right-handed female who follows up for migraines.  Brain MRIs from 2023 personally reviewed.  She is accompanied by her husband who supplements history.  UPDATE: Last seen in initial consultation in April 2023.  MRI of brain with and without contrast on 12/13/2021 revealed 3 mm focus of signal abnormality and punctate enhancement within the left parietal lobe.  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    At that time, topiramate  was increased to 50mg  at bedtime.  She was experiencing neck pain as well and was referred to Sports Medicine and had PT.  Both topiramate  and Ubrelvy  were ineffective, so she subsequently discontinued them.  She had daily left sided pain involving neck, shoulder, upper back, arm, torso, hip and leg.  When it radiates up to back of head, it can trigger a migraine.  She usually wakes up with a migraine.  They occur 2 times a week.  Thankfully, they have not been severe.  She treats with ibuprofen , which is more effective than Ubrelvy  (lasts few hours as compared with all day).     Current NSAIDS/analgesics:  ibuprofen  800mg  Current triptans:  none Current ergotamine:  none Current anti-emetic:  none Current muscle relaxants:  none Current Antihypertensive medications:  none Current Antidepressant  medications:  none Current Anticonvulsant medications:  none Current anti-CGRP:  none Current Vitamins/Herbal/Supplements:  none Current Antihistamines/Decongestants:  loratadine Other therapy:  none Hormone/birth control:  none  Caffeine :  1 to 2 cups of coffee daily, energy drink 2 a week Alcohol:  no Smoker:  marijuana Diet:  64 oz of water daily.  Usually does not skip meals.  Not necessarily healthy foods (egg rolls) Exercise:  upper body stretching, yoga Depression:  yes; Anxiety:  yes Other pain:  some back pain, leg pain, shoulder pain Sleep hygiene:  ok  HISTORY: Onset:  2022.  Progressively more severe and more frequent. Location:  across occipital region, primarily left sided Quality:  pressure/squeezing, throbbing Intensity:  severe 8-8.5/10 (once 9-10/10).  SABRA Quivers:  absent Prodrome:  absent Associated symptoms:  Nausea and vomiting when severe, sometimes phonophobia, lethargy, preceded by left sided muscle tightness in shoulder, trapezius, torso, neck and head.  She denies associated photophobia, unilateral numbness or weakness. Duration:  off and on in waves over 24 hours (sometimes 48 hours) Frequency:  4-5 days. 1 severe headache a week Frequency of abortive medication: none Triggers:  unknown Relieving factors:  none Activity:  movement aggravates These headaches are often debilitating.  Was seen in Urgent Care a week ago and received an injection.  No headache since then.   Past NSAIDS/analgesics:  acetaminophen, Fioricet, Excedrin, ibuprofen  Past abortive triptans:  sumatriptan  tab, rizatriptan  Past abortive ergotamine:  none Past muscle relaxants:  Flexeril  Past anti-emetic:  none Past antihypertensive medications:  none Past antidepressant medications:  sertraline Past anticonvulsant medications:  topiramate  Past anti-CGRP:  none Past vitamins/Herbal/Supplements:  none Past antihistamines/decongestants:  none Other past therapies:  none   Family  history of headache:  mom (probably migraines)  PAST MEDICAL HISTORY: Past Medical History:  Diagnosis Date   Depression    Tension headache     MEDICATIONS: Current Outpatient Medications on File Prior to Visit  Medication Sig Dispense Refill   cyclobenzaprine  (FLEXERIL ) 5 MG tablet Take 1 tablet (5 mg total) by mouth at bedtime as needed for muscle spasms. 15 tablet 0   ibuprofen  (ADVIL ) 800 MG tablet Take 1 tablet (800 mg total) by mouth every 8 (eight) hours as needed. 30 tablet 1   loratadine (CLARITIN) 10 MG tablet Take 10 mg by mouth daily.     Ubrogepant  (UBRELVY ) 100 MG TABS Take 1 tablet (100 mg total) by mouth as needed (as needed. May repeat after 2 hours. Max 2 tablets in 24 hours.). 16 tablet 2   No current facility-administered medications on file prior to visit.    ALLERGIES: Allergies  Allergen Reactions   Monistat [Miconazole]    Penicillins    Rizatriptan  Other (See Comments)    FAMILY HISTORY: Family History  Problem Relation Age of Onset   Arthritis Mother    Migraines Mother    Sleep apnea Mother    Migraines Maternal Grandmother    Seizures Maternal Grandmother        not epilepsy      Objective:  Blood pressure 105/65, pulse 88, height 5' 2 (1.575 m), weight 180 lb (81.6 kg), SpO2 99%. General: No acute distress.  Patient appears well-groomed.   Head:  Normocephalic/atraumatic Eyes:  Fundi examined but not visualized Neck: supple, left paraspinal tenderness, full range of motion Heart:  Regular rate and rhythm Neurological Exam: alert and oriented.  Speech fluent and not dysarthric, language intact.  CN II-XII intact. Bulk and tone normal, muscle strength 5/5 throughout.  Sensation to light touch intact.  Deep tendon reflexes 2+ throughout, toes downgoing.  Finger to nose testing intact.  Gait normal, Romberg negative.   Juliene Dunnings, DO  CC: Tabitha Dugal, FNP

## 2023-09-07 ENCOUNTER — Telehealth: Payer: Self-pay

## 2023-09-07 ENCOUNTER — Ambulatory Visit (INDEPENDENT_AMBULATORY_CARE_PROVIDER_SITE_OTHER): Payer: 59 | Admitting: Neurology

## 2023-09-07 ENCOUNTER — Encounter: Payer: Self-pay | Admitting: Neurology

## 2023-09-07 VITALS — BP 105/65 | HR 88 | Ht 62.0 in | Wt 180.0 lb

## 2023-09-07 DIAGNOSIS — G43019 Migraine without aura, intractable, without status migrainosus: Secondary | ICD-10-CM

## 2023-09-07 MED ORDER — AIMOVIG 140 MG/ML ~~LOC~~ SOAJ: 140.0000 mg | SUBCUTANEOUS | 5 refills | Status: DC

## 2023-09-07 NOTE — Patient Instructions (Signed)
 Start Aimovig  every 28 days Ibuprofen  for migraines but limit to no more than 9 days out of the month Keep headache diary Follow up with Dr. Cleora Daft

## 2023-09-07 NOTE — Telephone Encounter (Signed)
 Plan to start Aimovig  140 mg.  PA team please start PA for Aimovig  140 mg

## 2023-09-10 NOTE — Progress Notes (Signed)
[name] [address] [place] [address] Phone: [phone]   Assessment and Plan:    1. Chronic tension-type headache, not intractable 2. Neck pain on left side 3. Chronic left-sided thoracic back pain 4. Chronic left-sided low back pain without sciatica 5. Left leg pain 6. Somatic dysfunction of thoracic region 7. Somatic dysfunction of lumbar region 8. Somatic dysfunction of cervical region 9. Somatic dysfunction of pelvic region 10. Somatic dysfunction of rib region  -Chronic with exacerbation, subsequent visit - Patient presents with continued multiple areas of musculoskeletal concerns.  Primary areas include left-sided neck, left trapezius that will often trigger tension headaches/migraines.  Additional left lower back pain and left hip pain and left leg pain.  No specific MOI - Patient is concerned for a blood clot in left leg due to self-reported left leg swelling, history of taking long drives and being sedentary at computer.  No significant left leg swelling, no erythema, no warmth to left leg.  Normal WOB at today's visit.  Patient states she will have intermittent baseline chest discomfort and shortness of breath, this is not changed.  I have a very low suspicion for DVT.  Patient requested Doppler ultrasound to rule out DVT, so we will order Doppler ultrasound today. - Continue follow-ups with neurology, Dr. Everlena Cooper - Continue HEP and start new HEP for low back and hip - Patient has received relief with OMT in the past.  Elects for repeat OMT today.  Tolerated well per note below. - Decision today to treat with OMT was based on Physical Exam  After verbal consent patient was treated with HVLA (high velocity low amplitude), ME (muscle energy), FPR (flex positional release), ST (soft tissue), PC/PD (Pelvic Compression/ Pelvic Decompression) techniques in cervical, rib, thoracic, lumbar, and pelvic areas. Patient  tolerated the procedure well with improvement in symptoms.  Patient educated on potential side effects of soreness and recommended to rest, hydrate, and use Tylenol as needed for pain control.   15 additional minutes spent for educating Therapeutic Home Exercise Program.  This included exercises focusing on stretching, strengthening, with focus on eccentric aspects.   Long term goals include an improvement in range of motion, strength, endurance as well as avoiding reinjury. Patient's frequency would include in 1-2 times a day, 3-5 times a week for a duration of 6-12 weeks. Proper technique shown and discussed handout in great detail with ATC.  All questions were discussed and answered.    Pertinent previous records reviewed include neurology note 09/07/2023  Follow Up: 4 weeks for reevaluation.  Could consider repeat OMT.  Could x-ray areas of pain.  Could continue to offer physical therapy which patient declined at today's visit.   Subjective:   I, Amy Stanton, am serving as a Neurosurgeon for Doctor Richardean Sale  Chief Complaint: back and shoulder pain    HPI:  12/16/2021 Patient is a 33 year old female complaining of back and shoulder pain. Patient states that she has headaches hasnt really responded to much , thinks it might be tension headaches , uses a posture brace that helps with headaches  is an avid gamer, interested in OMT , shoulder radiates to head and neck left sided pain , yoga has really helped with shoulder pain and headaches    01/02/2022 Patient states that she went on a road trip and she was slacking on HEP is kind of achy , is feeling a lot of neck tension and shoulder tension,  headaches have started to die down hasn/'t had to use as much medication the HEP for the neck has really helped   09/11/2023  Patient states neurology recommended OMT    Relevant Historical Information: Chronic headaches Additional pertinent review of systems negative.   Current Outpatient  Medications:    cyclobenzaprine (FLEXERIL) 5 MG tablet, Take 1 tablet (5 mg total) by mouth at bedtime as needed for muscle spasms., Disp: 15 tablet, Rfl: 0   Erenumab-aooe (AIMOVIG) 140 MG/ML SOAJ, Inject 140 mg into the skin every 28 (twenty-eight) days., Disp: 1.12 mL, Rfl: 5   ibuprofen (ADVIL) 800 MG tablet, Take 1 tablet (800 mg total) by mouth every 8 (eight) hours as needed., Disp: 30 tablet, Rfl: 1   loratadine (CLARITIN) 10 MG tablet, Take 10 mg by mouth daily. 1/2 tab, Disp: , Rfl:    Ubrogepant (UBRELVY) 100 MG TABS, Take 1 tablet (100 mg total) by mouth as needed (as needed. May repeat after 2 hours. Max 2 tablets in 24 hours.)., Disp: 16 tablet, Rfl: 2   Objective:     Vitals:   09/11/23 1002  Pulse: 90  SpO2: 99%  Weight: 180 lb (81.6 kg)  Height: 5\' 2"  (1.575 m)      Body mass index is 32.92 kg/m.    Physical Exam:    General: Well-appearing, cooperative, sitting comfortably in no acute distress.   OMT Physical Exam:  ASIS Compression Test: Positive left Cervical: TTP paraspinal, C3-5 RL SL Rib: Bilateral elevated first rib with TTP, worse on right Thoracic: TTP paraspinal, T6 RRSR Lumbar: TTP paraspinal, L1-3 RLSR Pelvis: Left anterior innominate    Electronically signed by:  [name] 12:02 PM 09/11/23

## 2023-09-11 ENCOUNTER — Ambulatory Visit: Payer: 59 | Admitting: Sports Medicine

## 2023-09-11 VITALS — HR 90 | Ht 62.0 in | Wt 180.0 lb

## 2023-09-11 DIAGNOSIS — M9908 Segmental and somatic dysfunction of rib cage: Secondary | ICD-10-CM

## 2023-09-11 DIAGNOSIS — M79605 Pain in left leg: Secondary | ICD-10-CM

## 2023-09-11 DIAGNOSIS — M542 Cervicalgia: Secondary | ICD-10-CM

## 2023-09-11 DIAGNOSIS — M9905 Segmental and somatic dysfunction of pelvic region: Secondary | ICD-10-CM

## 2023-09-11 DIAGNOSIS — M9902 Segmental and somatic dysfunction of thoracic region: Secondary | ICD-10-CM

## 2023-09-11 DIAGNOSIS — M9901 Segmental and somatic dysfunction of cervical region: Secondary | ICD-10-CM

## 2023-09-11 DIAGNOSIS — G8929 Other chronic pain: Secondary | ICD-10-CM | POA: Diagnosis not present

## 2023-09-11 DIAGNOSIS — M9903 Segmental and somatic dysfunction of lumbar region: Secondary | ICD-10-CM

## 2023-09-11 DIAGNOSIS — M545 Low back pain, unspecified: Secondary | ICD-10-CM | POA: Diagnosis not present

## 2023-09-11 DIAGNOSIS — M546 Pain in thoracic spine: Secondary | ICD-10-CM | POA: Diagnosis not present

## 2023-09-11 DIAGNOSIS — G44229 Chronic tension-type headache, not intractable: Secondary | ICD-10-CM

## 2023-09-11 NOTE — Patient Instructions (Addendum)
Low back HEP  DVT study  Recommend heating pads 4 week follow up

## 2023-09-18 ENCOUNTER — Ambulatory Visit (HOSPITAL_COMMUNITY)
Admission: RE | Admit: 2023-09-18 | Discharge: 2023-09-18 | Disposition: A | Discharge status: Home / Self Care | Payer: 59 | Source: Ambulatory Visit | Attending: Sports Medicine | Admitting: Sports Medicine

## 2023-09-18 ENCOUNTER — Encounter: Payer: Self-pay | Admitting: Sports Medicine

## 2023-09-18 DIAGNOSIS — M79605 Pain in left leg: Secondary | ICD-10-CM | POA: Diagnosis present

## 2023-10-08 NOTE — Progress Notes (Unsigned)
   [name] [address] [place] [address] Phone: [phone]   Assessment and Plan:     There are no diagnoses linked to this encounter.  *** - Patient has received relief with OMT in the past.  Elects for repeat OMT today.  Tolerated well per note below. - Decision today to treat with OMT was based on Physical Exam   After verbal consent patient was treated with HVLA (high velocity low amplitude), ME (muscle energy), FPR (flex positional release), ST (soft tissue), PC/PD (Pelvic Compression/ Pelvic Decompression) techniques in cervical, rib, thoracic, lumbar, and pelvic areas. Patient tolerated the procedure well with improvement in symptoms.  Patient educated on potential side effects of soreness and recommended to rest, hydrate, and use Tylenol as needed for pain control.   Pertinent previous records reviewed include ***    Follow Up: ***     Subjective:   I, Amy Stanton, am serving as a Neurosurgeon for Doctor Richardean Sale  Chief Complaint: back and shoulder pain    HPI:  12/16/2021 Patient is a 33 year old female complaining of back and shoulder pain. Patient states that she has headaches hasnt really responded to much , thinks it might be tension headaches , uses a posture brace that helps with headaches  is an avid gamer, interested in OMT , shoulder radiates to head and neck left sided pain , yoga has really helped with shoulder pain and headaches    01/02/2022 Patient states that she went on a road trip and she was slacking on HEP is kind of achy , is feeling a lot of neck tension and shoulder tension, headaches have started to die down hasn/'t had to use as much medication the HEP for the neck has really helped    09/11/2023  Patient states neurology recommended OMT   10/09/2023 Patient states   Relevant Historical Information: Chronic headaches  Additional pertinent review of systems negative.  Current Outpatient  Medications  Medication Sig Dispense Refill   cyclobenzaprine (FLEXERIL) 5 MG tablet Take 1 tablet (5 mg total) by mouth at bedtime as needed for muscle spasms. 15 tablet 0   Erenumab-aooe (AIMOVIG) 140 MG/ML SOAJ Inject 140 mg into the skin every 28 (twenty-eight) days. 1.12 mL 5   ibuprofen (ADVIL) 800 MG tablet Take 1 tablet (800 mg total) by mouth every 8 (eight) hours as needed. 30 tablet 1   loratadine (CLARITIN) 10 MG tablet Take 10 mg by mouth daily. 1/2 tab     Ubrogepant (UBRELVY) 100 MG TABS Take 1 tablet (100 mg total) by mouth as needed (as needed. May repeat after 2 hours. Max 2 tablets in 24 hours.). 16 tablet 2   No current facility-administered medications for this visit.      Objective:     There were no vitals filed for this visit.    There is no height or weight on file to calculate BMI.    Physical Exam:     General: Well-appearing, cooperative, sitting comfortably in no acute distress.   OMT Physical Exam:  ASIS Compression Test: Positive Right Cervical: TTP paraspinal, *** Rib: Bilateral elevated first rib with TTP Thoracic: TTP paraspinal,*** Lumbar: TTP paraspinal,*** Pelvis: Right anterior innominate  Electronically signed by:  [name] 3:40 PM 10/08/23

## 2023-10-09 ENCOUNTER — Ambulatory Visit: Payer: 59 | Admitting: Sports Medicine

## 2023-10-09 VITALS — HR 87 | Ht 62.0 in | Wt 180.0 lb

## 2023-10-09 DIAGNOSIS — M9901 Segmental and somatic dysfunction of cervical region: Secondary | ICD-10-CM

## 2023-10-09 DIAGNOSIS — M546 Pain in thoracic spine: Secondary | ICD-10-CM | POA: Diagnosis not present

## 2023-10-09 DIAGNOSIS — M79605 Pain in left leg: Secondary | ICD-10-CM

## 2023-10-09 DIAGNOSIS — G44229 Chronic tension-type headache, not intractable: Secondary | ICD-10-CM

## 2023-10-09 DIAGNOSIS — M545 Low back pain, unspecified: Secondary | ICD-10-CM

## 2023-10-09 DIAGNOSIS — M9908 Segmental and somatic dysfunction of rib cage: Secondary | ICD-10-CM

## 2023-10-09 DIAGNOSIS — M9903 Segmental and somatic dysfunction of lumbar region: Secondary | ICD-10-CM

## 2023-10-09 DIAGNOSIS — M542 Cervicalgia: Secondary | ICD-10-CM | POA: Diagnosis not present

## 2023-10-09 DIAGNOSIS — M9902 Segmental and somatic dysfunction of thoracic region: Secondary | ICD-10-CM

## 2023-10-09 DIAGNOSIS — M9905 Segmental and somatic dysfunction of pelvic region: Secondary | ICD-10-CM

## 2023-10-09 DIAGNOSIS — G8929 Other chronic pain: Secondary | ICD-10-CM

## 2023-11-06 ENCOUNTER — Ambulatory Visit: Admitting: Sports Medicine

## 2023-11-12 NOTE — Progress Notes (Unsigned)
   Amy Stanton D.[facility] [address] [place] [address] Phone: [phone]   Assessment and Plan:     There are no diagnoses linked to this encounter.  *** - Patient has received relief with OMT in the past.  Elects for repeat OMT today.  Tolerated well per note below. - Decision today to treat with OMT was based on Physical Exam   After verbal consent patient was treated with HVLA (high velocity low amplitude), ME (muscle energy), FPR (flex positional release), ST (soft tissue), PC/PD (Pelvic Compression/ Pelvic Decompression) techniques in cervical, rib, thoracic, lumbar, and pelvic areas. Patient tolerated the procedure well with improvement in symptoms.  Patient educated on potential side effects of soreness and recommended to rest, hydrate, and use Tylenol as needed for pain control.   Pertinent previous records reviewed include ***    Follow Up: ***     Subjective:   I, Amy Stanton, am serving as a Neurosurgeon for Doctor Richardean Sale  Chief Complaint: back and shoulder pain    HPI:  12/16/2021 Patient is a 33 year old female complaining of back and shoulder pain. Patient states that she has headaches hasnt really responded to much , thinks it might be tension headaches , uses a posture brace that helps with headaches  is an avid gamer, interested in OMT , shoulder radiates to head and neck left sided pain , yoga has really helped with shoulder pain and headaches    01/02/2022 Patient states that she went on a road trip and she was slacking on HEP is kind of achy , is feeling a lot of neck tension and shoulder tension, headaches have started to die down hasn/'t had to use as much medication the HEP for the neck has really helped    09/11/2023  Patient states neurology recommended OMT    10/09/2023 Patient states she is okay. Last week to 2 have been high pain days   11/13/2023 Patient states  Relevant Historical Information: Chronic  headaches  Additional pertinent review of systems negative.  Current Outpatient Medications  Medication Sig Dispense Refill   cyclobenzaprine (FLEXERIL) 5 MG tablet Take 1 tablet (5 mg total) by mouth at bedtime as needed for muscle spasms. 15 tablet 0   Erenumab-aooe (AIMOVIG) 140 MG/ML SOAJ Inject 140 mg into the skin every 28 (twenty-eight) days. 1.12 mL 5   ibuprofen (ADVIL) 800 MG tablet Take 1 tablet (800 mg total) by mouth every 8 (eight) hours as needed. 30 tablet 1   loratadine (CLARITIN) 10 MG tablet Take 10 mg by mouth daily. 1/2 tab     Ubrogepant (UBRELVY) 100 MG TABS Take 1 tablet (100 mg total) by mouth as needed (as needed. May repeat after 2 hours. Max 2 tablets in 24 hours.). 16 tablet 2   No current facility-administered medications for this visit.      Objective:     There were no vitals filed for this visit.    There is no height or weight on file to calculate BMI.    Physical Exam:     General: Well-appearing, cooperative, sitting comfortably in no acute distress.   OMT Physical Exam:  ASIS Compression Test: Positive Right Cervical: TTP paraspinal, *** Rib: Bilateral elevated first rib with TTP Thoracic: TTP paraspinal,*** Lumbar: TTP paraspinal,*** Pelvis: Right anterior innominate  Electronically signed by:  Amy Stanton [facility] 7:36 AM 11/12/23

## 2023-11-13 ENCOUNTER — Ambulatory Visit: Admitting: Sports Medicine

## 2023-11-13 VITALS — HR 74 | Ht 62.0 in | Wt 180.0 lb

## 2023-11-13 DIAGNOSIS — M546 Pain in thoracic spine: Secondary | ICD-10-CM | POA: Diagnosis not present

## 2023-11-13 DIAGNOSIS — M9901 Segmental and somatic dysfunction of cervical region: Secondary | ICD-10-CM

## 2023-11-13 DIAGNOSIS — M9902 Segmental and somatic dysfunction of thoracic region: Secondary | ICD-10-CM

## 2023-11-13 DIAGNOSIS — M9903 Segmental and somatic dysfunction of lumbar region: Secondary | ICD-10-CM | POA: Diagnosis not present

## 2023-11-13 DIAGNOSIS — G44229 Chronic tension-type headache, not intractable: Secondary | ICD-10-CM | POA: Diagnosis not present

## 2023-11-13 DIAGNOSIS — M9905 Segmental and somatic dysfunction of pelvic region: Secondary | ICD-10-CM

## 2023-11-13 DIAGNOSIS — M9908 Segmental and somatic dysfunction of rib cage: Secondary | ICD-10-CM

## 2023-11-13 DIAGNOSIS — G8929 Other chronic pain: Secondary | ICD-10-CM

## 2023-12-10 ENCOUNTER — Other Ambulatory Visit (HOSPITAL_COMMUNITY): Payer: Self-pay

## 2023-12-12 ENCOUNTER — Telehealth: Payer: Self-pay

## 2023-12-12 ENCOUNTER — Other Ambulatory Visit (HOSPITAL_COMMUNITY): Payer: Self-pay

## 2023-12-12 NOTE — Telephone Encounter (Signed)
 Pharmacy Patient Advocate Encounter   Received notification from CoverMyMeds that prior authorization for Ubrelvy  100MG  tablets is required/requested.   Insurance verification completed.   The patient is insured through [employer] .   Prior Authorization for Ubrelvy  100MG  tablets has been APPROVED from 11-12-2023 to 12-11-2024   PA #/Case ID/Reference #: [id number]

## 2023-12-18 ENCOUNTER — Ambulatory Visit: Admitting: Sports Medicine

## 2024-01-28 ENCOUNTER — Other Ambulatory Visit: Payer: Self-pay | Admitting: Internal Medicine

## 2024-01-28 ENCOUNTER — Ambulatory Visit (INDEPENDENT_AMBULATORY_CARE_PROVIDER_SITE_OTHER): Admitting: Internal Medicine

## 2024-01-28 ENCOUNTER — Ambulatory Visit: Payer: Self-pay

## 2024-01-28 VITALS — BP 114/70 | HR 93 | Temp 98.9°F | Ht 62.0 in | Wt 179.0 lb

## 2024-01-28 DIAGNOSIS — F419 Anxiety disorder, unspecified: Secondary | ICD-10-CM | POA: Insufficient documentation

## 2024-01-28 DIAGNOSIS — K2100 Gastro-esophageal reflux disease with esophagitis, without bleeding: Secondary | ICD-10-CM

## 2024-01-28 DIAGNOSIS — K219 Gastro-esophageal reflux disease without esophagitis: Secondary | ICD-10-CM | POA: Insufficient documentation

## 2024-01-28 MED ORDER — FAMOTIDINE 20 MG PO TABS: 20.0000 mg | ORAL_TABLET | Freq: Two times a day (BID) | ORAL | 1 refills | Status: AC

## 2024-01-28 MED ORDER — ONDANSETRON HCL 4 MG PO TABS: 4.0000 mg | ORAL_TABLET | Freq: Three times a day (TID) | ORAL | 0 refills | Status: AC | PRN

## 2024-01-28 MED ORDER — CYPROHEPTADINE HCL 4 MG PO TABS: 4.0000 mg | ORAL_TABLET | Freq: Every day | ORAL | 1 refills | Status: DC

## 2024-01-28 NOTE — Assessment & Plan Note (Signed)
 Does seem to have symptoms suggestive of esophageal damage with these spells Didn't do well with omeprazole or sucralfate Will have her try famotidine 20 bid Ondansetron  for prn use for nausea Consider liquid maalox/mylanta also for prn

## 2024-01-28 NOTE — Assessment & Plan Note (Signed)
 Long standing Did poorly with sertraline  Mostly doing okay till 7-8 days ago  Her night spells seem like it may be reflux related--which she has had in the past Will give cyproheptadine to try at bedtime--discussed it is fairly safe and doesn't need to be taken every day

## 2024-01-28 NOTE — Telephone Encounter (Signed)
 Agree with plan. Thank you for seeing her Dr. Letvak

## 2024-01-28 NOTE — Patient Instructions (Signed)
 Please try famotidine 20mg  twice a day for now--for the acid. If you wake up with nausea, etc---try a liquid antacid like mylanta or maalox. I have given you ondansetron  for the nausea The cyproheptadine can help prevent nausea and headaches taken daily at bedtime (and has mild anti-anxiety features). Try this also at bedtime and review with Tabitha.

## 2024-01-28 NOTE — Telephone Encounter (Signed)
 FYI Only or Action Required?: FYI only for provider.  Patient was last seen in primary care on 09/23/2021 by Corwin Antu, FNP. Called Nurse Triage reporting Anxiety. Symptoms began a week ago. Interventions attempted: Rest, hydration, or home remedies and Dietary changes. Symptoms are: gradually worsening.  Triage Disposition: See PCP When Office is Open (Within 3 Days)  Patient/caregiver understands and will follow disposition?: Yes  Scheduled appt today at 300 with Dr. Jimmy.   Copied from CRM [id number]. Topic: Clinical - Red Word Triage >> Jan 28, 2024 12:51 PM Thersia C wrote: [facility] that prompted transfer to Nurse Triage: havent been able to sleep, been waking up arounf 3 am to 7 am and heart racing sick to stomach, throwup several times and will last until 3 to 5 in the afternoon, constantly nausea , lay on back it helps but doesnt go away Reason for Disposition  [1] Anxiety symptoms AND [2] has not been evaluated for this by doctor (or NP/PA)  Answer Assessment - Initial Assessment Questions 1. CONCERN: Did anything happen that prompted you to call today?      Feeling miserable  2. ANXIETY SYMPTOMS: Can you describe how you (your loved one; patient) have been feeling? (e.g., tense, restless, panicky, anxious, keyed up, overwhelmed, sense of impending doom).      Restless, anxious  3. ONSET: How long have you been feeling this way? (e.g., hours, days, weeks)     Over a week  4. SEVERITY: How would you rate the level of anxiety? (e.g., 0 - 10; or mild, moderate, severe).     Mild to moderate  5. FUNCTIONAL IMPAIRMENT: How have these feelings affected your ability to do daily activities? Have you had more difficulty than usual doing your normal daily activities? (e.g., getting better, same, worse; self-care, school, work, interactions)     Affecting daily life 6. HISTORY: Have you felt this way before? Have you ever been diagnosed with an anxiety problem in the past?  (e.g., generalized anxiety disorder, panic attacks, PTSD). If Yes, ask: How was this problem treated? (e.g., medicines, counseling, etc.)     Yes but not on medication 7. RISK OF HARM - SUICIDAL IDEATION: Do you ever have thoughts of hurting or killing yourself? If Yes, ask:  Do you have these feelings now? Do you have a plan on how you would do this?     no 11. PATIENT SUPPORT: Who is with you now? Who do you live with? Do you have family or friends who you can talk to?        Husband  12. OTHER SYMPTOMS: Do you have any other symptoms? (e.g., feeling depressed, trouble concentrating, trouble sleeping, trouble breathing, palpitations or fast heartbeat, chest pain, sweating, nausea, or diarrhea)       Difficulty sleeping, nausea, vomiting, heart racing 13. PREGNANCY: Is there any chance you are pregnant? When was your last menstrual period?       No fallopian tubes  Protocols used: Anxiety and Panic Attack-A-AH

## 2024-01-28 NOTE — Progress Notes (Signed)
 Subjective:    Patient ID: Amy Stanton, female    DOB: 20-May-1991, 33 y.o.   MRN: [id number]  HPI Here due to concerns about possible anxiety  Since a week ago--has woken up with stomach ache, diarrhea, high anxiety, nausea and throwing up Now this has happened every morning (from 3-7AM)----heart racing. Not able to calm herself Gets up--either throws up--or tries a drink of water and standing in front of fan (or may then vomit) Can't just lie back down---or wakens with more anxiety/nausea, etc Now just sleeping on the cough No clear fever despite the night flashes and sweating Will jolt awake---panic/nausea/shivering cold (though sweating) Finally feels ready to eat in the afternoon Feels fine on getting in bed--and can initiate sleep okay. Then awakens some hours later will waken again with the symptoms  Mostly staying home and on couch  Has had anxiety my entire life Never like this though Chronic depression--but no cloud over my shoulders Had trouble with sertraline--couldn't sleep. Brain zaps with adjusting/decreasing dose  No new obvious stressors Stays at home No financial or marital concerns Doesn't really watch the news  Does note gassy feeling and burping Stools now soft--and loose once (on the first night of these symptoms)  Has some left over medical marijuana --tried some with the anxiety attacks--but hasn't helped with these spells (had been effective in the past)  Mostly sitting on the couch-tried extra pillows in bed last night--but still had trouble  Current Outpatient Medications on File Prior to Visit  Medication Sig Dispense Refill   cyclobenzaprine  (FLEXERIL ) 5 MG tablet Take 1 tablet (5 mg total) by mouth at bedtime as needed for muscle spasms. 15 tablet 0   ibuprofen  (ADVIL ) 800 MG tablet Take 1 tablet (800 mg total) by mouth every 8 (eight) hours as needed. 30 tablet 1   loratadine (CLARITIN) 10 MG tablet Take 10 mg by mouth daily. 1/2 tab      Erenumab -aooe (AIMOVIG ) 140 MG/ML SOAJ Inject 140 mg into the skin every 28 (twenty-eight) days. (Patient not taking: Reported on 01/28/2024) 1.12 mL 5   No current facility-administered medications on file prior to visit.    Allergies  Allergen Reactions   Monistat [Miconazole]    Penicillins    Rizatriptan  Other (See Comments)    Past Medical History:  Diagnosis Date   Depression    Tension headache     Past Surgical History:  Procedure Laterality Date   BILATERAL SALPINGECTOMY Bilateral 02/08/2021   pt chose to do this for suspected endometrosis    Family History  Problem Relation Age of Onset   Arthritis Mother    Migraines Mother    Sleep apnea Mother    Migraines Maternal Grandmother    Seizures Maternal Grandmother        not epilepsy    Social History   Socioeconomic History   Marital status: Married    Spouse name: david   Number of children: 0   Years of education: Not on file   Highest education level: 12th grade  Occupational History   Not on file  Tobacco Use   Smoking status: Never   Smokeless tobacco: Never  Vaping Use   Vaping status: Never Used  Substance and Sexual Activity   Alcohol use: Never   Drug use: Yes    Types: Marijuana    Comment: inhalation, daily, helps her regulate her anxiety/intestinal issues   Sexual activity: Yes    Partners: Male    Birth control/protection:  Surgical  Other Topics Concern   Not on file  Social History Narrative   Home maker    Right handed   Social Drivers of Health   Financial Resource Strain: Low Risk  (01/28/2024)   Overall Financial Resource Strain (CARDIA)    Difficulty of Paying Living Expenses: Not very hard  Food Insecurity: No Food Insecurity (01/28/2024)   Hunger Vital Sign    Worried About Running Out of Food in the Last Year: Never true    Ran Out of Food in the Last Year: Never true  Transportation Needs: No Transportation Needs (01/28/2024)   PRAPARE - Doctor, general practice (Medical): No    Lack of Transportation (Non-Medical): No  Physical Activity: Insufficiently Active (01/28/2024)   Exercise Vital Sign    Days of Exercise per Week: 2 days    Minutes of Exercise per Session: 50 min  Stress: Stress Concern Present (01/28/2024)   [employer] of Occupational Health - Occupational Stress Questionnaire    Feeling of Stress: Rather much  Social Connections: Moderately Isolated (01/28/2024)   Social Connection and Isolation Panel    Frequency of Communication with Friends and Family: More than three times a week    Frequency of Social Gatherings with Friends and Family: Three times a week    Attends Religious Services: Never    Active Member of Clubs or Organizations: No    Attends Engineer, structural: Not on file    Marital Status: Married  Catering manager Violence: Not on file   Review of Systems Heartburn in the past--but not so much with this entire episode  Some dysphagia first thing in the morning---gag kicks in [place] with bad taste in her mouth Has used omeprazole in the past--actually made things worse Sucralfate didn't help either    Objective:   Physical Exam Constitutional:      Appearance: Normal appearance.   Cardiovascular:     Rate and Rhythm: Normal rate and regular rhythm.     Heart sounds: No murmur heard.    No gallop.  Pulmonary:     Effort: Pulmonary effort is normal.     Breath sounds: Normal breath sounds. No wheezing or rales.  Abdominal:     General: There is no distension.     Palpations: Abdomen is soft.     Tenderness: There is no guarding or rebound.     Comments: Mild epigastric tenderness   Musculoskeletal:     Cervical back: Neck supple.     Right lower leg: No edema.     Left lower leg: No edema.  Lymphadenopathy:     Cervical: No cervical adenopathy.   Neurological:     Mental Status: She is alert.   Psychiatric:     Comments: No overt depression             Assessment & Plan:

## 2024-02-04 ENCOUNTER — Ambulatory Visit: Admitting: Family

## 2024-02-11 ENCOUNTER — Ambulatory Visit: Admitting: Family

## 2024-02-21 ENCOUNTER — Ambulatory Visit (INDEPENDENT_AMBULATORY_CARE_PROVIDER_SITE_OTHER): Admitting: Family

## 2024-02-21 ENCOUNTER — Encounter: Payer: Self-pay | Admitting: Family

## 2024-02-21 VITALS — BP 134/64 | HR 90 | Temp 98.7°F | Ht 62.0 in | Wt 185.2 lb

## 2024-02-21 DIAGNOSIS — R12 Heartburn: Secondary | ICD-10-CM | POA: Diagnosis not present

## 2024-02-21 DIAGNOSIS — F419 Anxiety disorder, unspecified: Secondary | ICD-10-CM | POA: Diagnosis not present

## 2024-02-21 MED ORDER — HYDROXYZINE HCL 10 MG PO TABS
10.0000 mg | ORAL_TABLET | Freq: Three times a day (TID) | ORAL | 0 refills | Status: DC | PRN
Start: 2024-02-21 — End: 2024-02-21

## 2024-02-21 MED ORDER — CITALOPRAM HYDROBROMIDE 10 MG PO TABS: 10.0000 mg | ORAL_TABLET | Freq: Every day | ORAL | 1 refills | Status: DC

## 2024-02-21 MED ORDER — HYDROXYZINE HCL 10 MG PO TABS: 10.0000 mg | ORAL_TABLET | Freq: Three times a day (TID) | ORAL | 0 refills | Status: AC | PRN

## 2024-02-21 MED ORDER — CITALOPRAM HYDROBROMIDE 10 MG PO TABS: 10.0000 mg | ORAL_TABLET | Freq: Every day | ORAL | 1 refills | Status: AC

## 2024-02-21 NOTE — Progress Notes (Addendum)
 Established Patient Office Visit  Subjective:      CC:  Chief Complaint  Patient presents with   Anxiety   Gastroesophageal Reflux   Attention Span    Patient would like to discuss possible medication long term for all three things    HPI: Amy Stanton is a 33 y.o. female presenting on 02/21/2024 for Anxiety, Gastroesophageal Reflux, and Attention Span (Patient would like to discuss possible medication long term for all three things)  Acid reflux: last visit started on pepcid  20 mg bid zofran  prn and has had improvement.   Anxiety, was given cyproheptadine  to try out for prn. She has been taking this nightly and it has been helpful in terms to not waking up in the middle of the night with anxiety but she does still does have problems with her anxiety. She states depression is less frequent but still there. Anxiety is starting to interfere with her daily life, when she has things on her schedule such as an appt she is panicking over this for an hour prior and hard to calm herself down. She has tried sertraline in the past as sit didn't allow her to sleep. ADD symptoms have been hard as well as she is having trouble completing her tasks. She has been dx with ADD in the past mainly throughout school as it was hard for her to focus.   Has been seeing a therapist for a bit over one year but is no longer seeing them as this was when she lived in [place] .  Does notice she is burping and having more flatulence. She is gluten intolerant. She does eat very minimal dairy as well. No abdominal pain.       Social history:  Relevant past medical, surgical, family and social history reviewed and updated as indicated. Interim medical history since our last visit reviewed.  Allergies and medications reviewed and updated.  DATA REVIEWED: CHART IN EPIC     ROS: Negative unless specifically indicated above in HPI.    Current Outpatient Medications:    cyclobenzaprine  (FLEXERIL ) 5 MG  tablet, Take 1 tablet (5 mg total) by mouth at bedtime as needed for muscle spasms., Disp: 15 tablet, Rfl: 0   famotidine  (PEPCID ) 20 MG tablet, Take 1 tablet (20 mg total) by mouth 2 (two) times daily., Disp: 60 tablet, Rfl: 1   ibuprofen  (ADVIL ) 800 MG tablet, Take 1 tablet (800 mg total) by mouth every 8 (eight) hours as needed., Disp: 30 tablet, Rfl: 1   loratadine (CLARITIN) 10 MG tablet, Take 10 mg by mouth daily. 1/2 tab, Disp: , Rfl:    ondansetron  (ZOFRAN ) 4 MG tablet, Take 1 tablet (4 mg total) by mouth 3 (three) times daily as needed for nausea or vomiting., Disp: 30 tablet, Rfl: 0   citalopram  (CELEXA ) 10 MG tablet, Take 1 tablet (10 mg total) by mouth daily., Disp: 30 tablet, Rfl: 1   hydrOXYzine  (ATARAX ) 10 MG tablet, Take 1 tablet (10 mg total) by mouth 3 (three) times daily as needed for anxiety., Disp: 30 tablet, Rfl: 0      Objective:    BP 134/64   Pulse 90   Temp 98.7 F (37.1 C) (Temporal)   Ht 5' 2 (1.575 m)   Wt 185 lb 3.2 oz (84 kg)   SpO2 98%   BMI 33.87 kg/m   Wt Readings from Last 3 Encounters:  02/21/24 185 lb 3.2 oz (84 kg)  01/28/24 179 lb (81.2 kg)  11/13/23 180  lb (81.6 kg)    Physical Exam Vitals reviewed.  Constitutional:      General: She is not in acute distress.    Appearance: Normal appearance. She is normal weight. She is not ill-appearing, toxic-appearing or diaphoretic.  HENT:     Head: Normocephalic.  Cardiovascular:     Rate and Rhythm: Normal rate.  Pulmonary:     Effort: Pulmonary effort is normal.  Musculoskeletal:        General: Normal range of motion.  Neurological:     General: No focal deficit present.     Mental Status: She is alert and oriented to person, place, and time. Mental status is at baseline.  Psychiatric:        Mood and Affect: Mood normal.        Behavior: Behavior normal.        Thought Content: Thought content normal.        Judgment: Judgment normal.           Assessment & Plan:   Anxiety Assessment & Plan: Trial celexa  10 mg once daily  Trial hydroxyzine  10 mg prn  Referral to psychology I instructed pt to start celexa  1/2 tablet once daily for 1 week and then increase to a full tablet once daily on week two as tolerated.  We discussed common side effects such as nausea, drowsiness and weight gain.  Also discussed rare but serious side effect of suicidal ideation.  She is instructed to discontinue medication and go directly to ED if this occurs.  Pt verbalizes understanding.  Plan is to follow up in 30 days to evaluate progress.     Orders: -     Ambulatory referral to Psychology -     hydrOXYzine  HCl; Take 1 tablet (10 mg total) by mouth 3 (three) times daily as needed for anxiety.  Dispense: 30 tablet; Refill: 0 -     Citalopram  Hydrobromide; Take 1 tablet (10 mg total) by mouth daily.  Dispense: 30 tablet; Refill: 1  Heartburn Assessment & Plan: Much improved with pepcid   Try to decrease and or avoid spicy foods, fried fatty foods, and also caffeine  and chocolate as these can increase heartburn symptoms.    Orders: -     H. pylori breath test; Future -     Alpha-Gal Panel; Future     Return in about 6 weeks (around 04/03/2024) for f/u anxiety.  Ginger Patrick, MSN, APRN, FNP-C [facility]

## 2024-02-21 NOTE — Assessment & Plan Note (Signed)
 Trial celexa  10 mg once daily  Trial hydroxyzine  10 mg prn  Referral to psychology I instructed pt to start celexa  1/2 tablet once daily for 1 week and then increase to a full tablet once daily on week two as tolerated.  We discussed common side effects such as nausea, drowsiness and weight gain.  Also discussed rare but serious side effect of suicidal ideation.  She is instructed to discontinue medication and go directly to ED if this occurs.  Pt verbalizes understanding.  Plan is to follow up in 30 days to evaluate progress.

## 2024-02-21 NOTE — Addendum Note (Signed)
 Addended by: CORWIN ANTU on: 02/21/2024 12:26 PM   Modules accepted: Orders

## 2024-02-21 NOTE — Patient Instructions (Signed)
Start lexapro 10 mg for anxiety and depression. Take 1/2 tablet by mouth once daily for about one week, then increase to 1 full tablet thereafter.   Taking the medicine as directed and not missing any doses is one of the best things you can do to treat your anxiety/depression.  Here are some things to keep in mind:  Side effects (stomach upset, some increased anxiety) may happen before you notice a benefit.  These side effects typically go away over time. Changes to your dose of medicine or a change in medication all together is sometimes necessary Many people will notice an improvement within two weeks but the full effect of the medication can take up to 4-6 weeks Stopping the medication when you start feeling better often results in a return of symptoms. Most people need to be on medication at least 6-12 months If you start having thoughts of hurting yourself or others after starting this medicine, please call me immediately.

## 2024-02-21 NOTE — Assessment & Plan Note (Signed)
 Much improved with pepcid   Try to decrease and or avoid spicy foods, fried fatty foods, and also caffeine  and chocolate as these can increase heartburn symptoms.

## 2024-02-26 ENCOUNTER — Encounter: Payer: Self-pay | Admitting: Family

## 2024-03-07 NOTE — Progress Notes (Signed)
 Virtual Visit via Video Note  Consent was obtained for video visit:  Yes.   Answered questions that patient had about telehealth interaction:  Yes.   I discussed the limitations, risks, security and privacy concerns of performing an evaluation and management service by telemedicine. I also discussed with the patient that there may be a patient responsible charge related to this service. The patient expressed understanding and agreed to proceed.  Pt location: Home Physician Location: office Name of referring provider:  Corwin Antu, FNP I connected with Amy Stanton at patients initiation/request on 03/10/2024 at  9:50 AM EDT by video enabled telemedicine application and verified that I am speaking with the correct person using two identifiers. Pt MRN:  [id number] Pt DOB:  09/15/1990 Video Participants:  Amy Stanton  Assessment/Plan:   Migraine without aura, without status migrainosus, not intractable    Migraine prevention:  none Migraine rescue:  She will pick up samples of Nurtec Keep headache diary Follow up with me in 8 months.     Subjective:  Amy Stanton is a 33 year old right-handed female who follows up for migraines.    UPDATE: Did not start Aimovig .  A prior authorization was not done.  Instead, she has been increased hydration, but headaches were worsening because she was overhydrated.  She started drinking Gatorade and increased sodium and headaches improved.   Intensity:  severe Duration:  less than 12 hours. Frequency:  2 in last 3 months.  No longer having the milder headaches.    No longer seeing Sports Medicine.  Still performing home exercises.     Current NSAIDS/analgesics:  ibuprofen  800mg  Current triptans:  none Current ergotamine:  none Current anti-emetic:  none Current muscle relaxants:  none Current Antihypertensive medications:  none Current Antidepressant medications:  none Current Anticonvulsant medications:  none Current  anti-CGRP: none Current Vitamins/Herbal/Supplements:  none Current Antihistamines/Decongestants:  loratadine Other therapy:  none Hormone/birth control:  none  Caffeine :  1 to 2 cups of coffee daily, energy drink 2 a week Alcohol:  no Smoker:  marijuana Diet:  64 oz of water daily.  Usually does not skip meals.  Not necessarily healthy foods (egg rolls) Exercise:  upper body stretching, yoga Depression:  yes; Anxiety:  yes Other pain:  some back pain, leg pain, shoulder pain Sleep hygiene:  ok  HISTORY: Onset:  2022.  Progressively more severe and more frequent. Location:  across occipital region, primarily left sided Quality:  pressure/squeezing, throbbing Intensity:  severe 8-8.5/10 (once 9-10/10).  SABRA Quivers:  absent Prodrome:  absent Associated symptoms:  Nausea and vomiting when severe, sometimes phonophobia, lethargy, preceded by left sided muscle tightness in shoulder, trapezius, torso, neck and head.  She denies associated photophobia, unilateral numbness or weakness. Duration:  off and on in waves over 24 hours (sometimes 48 hours) Frequency:  4-5 days. 1 severe headache a week Frequency of abortive medication: none Triggers:  unknown Relieving factors:  none Activity:  movement aggravates These headaches are often debilitating.     MRI of brain with and without contrast on 12/13/2021 revealed 3 mm focus of signal abnormality and punctate enhancement within the left parietal lobe.  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     Past NSAIDS/analgesics:  acetaminophen, Fioricet, Excedrin, ibuprofen  Past abortive triptans:  sumatriptan  tab, rizatriptan  Past abortive ergotamine:  none Past muscle relaxants:  Flexeril  Past anti-emetic:  none Past antihypertensive medications:  none Past antidepressant medications:  sertraline Past anticonvulsant medications:  topiramate  Past anti-CGRP:  Ubrelvy  (ineffective) Past  vitamins/Herbal/Supplements:  none Past antihistamines/decongestants:  none Other past therapies:  [facility]   Family history of headache:  mom (probably migraines)  Past Medical History: Past Medical History:  Diagnosis Date   Depression    Tension headache     Medications: Outpatient Encounter Medications as of 03/10/2024  Medication Sig   citalopram  (CELEXA ) 10 MG tablet Take 1 tablet (10 mg total) by mouth daily.   cyclobenzaprine  (FLEXERIL ) 5 MG tablet Take 1 tablet (5 mg total) by mouth at bedtime as needed for muscle spasms.   famotidine  (PEPCID ) 20 MG tablet Take 1 tablet (20 mg total) by mouth 2 (two) times daily.   hydrOXYzine  (ATARAX ) 10 MG tablet Take 1 tablet (10 mg total) by mouth 3 (three) times daily as needed for anxiety.   ibuprofen  (ADVIL ) 800 MG tablet Take 1 tablet (800 mg total) by mouth every 8 (eight) hours as needed.   loratadine (CLARITIN) 10 MG tablet Take 10 mg by mouth daily. 1/2 tab   ondansetron  (ZOFRAN ) 4 MG tablet Take 1 tablet (4 mg total) by mouth 3 (three) times daily as needed for nausea or vomiting.   No facility-administered encounter medications on file as of 03/10/2024.    Allergies: Allergies  Allergen Reactions   Monistat [Miconazole]    Penicillins    Rizatriptan  Other (See Comments)   Cyproheptadine  Other (See Comments)   Sertraline     Couldn't sleep    Family History: Family History  Problem Relation Age of Onset   Arthritis Mother    Migraines Mother    Sleep apnea Mother    Migraines Maternal Grandmother    Seizures Maternal Grandmother        not epilepsy    Observations/Objective:   No acute distress.  Alert and oriented.  Speech fluent and not dysarthric.  Language intact.  Eyes orthophoric on primary gaze.  Face symmetric.   Follow Up Instructions:    -I discussed the assessment and treatment plan with the patient. The patient was provided an opportunity to ask questions and all were answered. The patient agreed with  the plan and demonstrated an understanding of the instructions.   The patient was advised to call back or seek an in-person evaluation if the symptoms worsen or if the condition fails to improve as anticipated.   Juliene Lamar Dunnings, DO

## 2024-03-10 ENCOUNTER — Encounter: Payer: Self-pay | Admitting: Neurology

## 2024-03-10 ENCOUNTER — Telehealth: Payer: Self-pay | Admitting: Neurology

## 2024-03-10 DIAGNOSIS — G43019 Migraine without aura, intractable, without status migrainosus: Secondary | ICD-10-CM | POA: Diagnosis not present

## 2024-09-04 ENCOUNTER — Ambulatory Visit: Payer: Self-pay

## 2024-09-04 NOTE — Telephone Encounter (Signed)
 FYI Only or Action Required?: FYI only for provider: appointment scheduled on 2/10.  Patient was last seen in primary care on 02/21/2024 by Corwin Antu, FNP.  Called Nurse Triage reporting Flank Pain.  Symptoms began several days ago.  Interventions attempted: Nothing.  Symptoms are: stable.  Triage Disposition: See PCP When Office is Open (Within 3 Days)  Patient/caregiver understands and will follow disposition?: Yes    Reason for Triage: right side flank pain that began last week.   Reason for Disposition  [1] MILD pain (i.e., scale 1-3; does not interfere with normal activities) AND [2] present > 3 days  Answer Assessment - Initial Assessment Questions 1. LOCATION: Where does it hurt? (e.g., left, right)     R flank  2. ONSET: When did the pain start?     Approx 1 week ago  3. SEVERITY: How bad is the pain? (e.g., Scale 1-10; mild, moderate, or severe)     (Initially it was a sharp pain 1 week ago) Pt reports it is a dull, uncomfortable pain  4. PATTERN: Does the pain come and go, or is it constant?      Come and go   5. CAUSE: What do you think is causing the pain?     Pt states this all started when she was using the bathroom 1 week ago  6. OTHER SYMPTOMS:  Do you have any other symptoms? (e.g., fever, abdomen pain, vomiting, leg weakness, burning with urination, blood in urine)     Denies  7. PREGNANCY:  Is there any chance you are pregnant? When was your last menstrual period?     No I'm sterilized  Protocols used: Flank Pain-A-AH

## 2024-09-04 NOTE — Telephone Encounter (Signed)
 Spoke with Amy Stanton. I offered her an appointment tomorrow with Dr. Avelina but Amy Stanton declined as she is not available for an appointment until 09/09/24. ED and urgent care precautions have been gone over with the Amy Stanton.

## 2024-09-05 NOTE — Telephone Encounter (Signed)
 ED and UC precautions have been gone over with the pt.

## 2024-09-05 NOTE — Telephone Encounter (Signed)
 I would just advise her she might have a kidney stone \\so  maybe go to urgent care this weekend if worsening pain

## 2024-09-09 ENCOUNTER — Ambulatory Visit: Admitting: Family

## 2024-11-10 ENCOUNTER — Ambulatory Visit: Admitting: Neurology
